# Patient Record
Sex: Male | Born: 1980 | Race: White | Hispanic: No | Marital: Single | Smoking: Current every day smoker
Health system: Southern US, Community
[De-identification: ages and names within clinical notes are randomized; demographics above are authoritative.]

## PROBLEM LIST (undated history)

## (undated) ENCOUNTER — Emergency Department (HOSPITAL_COMMUNITY): Admission: EM | Disposition: A | Discharge status: Other Acute Inpatient Hospital | Payer: Self-pay

## (undated) DIAGNOSIS — E119 Type 2 diabetes mellitus without complications: Secondary | ICD-10-CM

## (undated) DIAGNOSIS — F419 Anxiety disorder, unspecified: Secondary | ICD-10-CM

## (undated) DIAGNOSIS — F32A Depression, unspecified: Secondary | ICD-10-CM

## (undated) HISTORY — PX: KNEE ARTHROSCOPY: SHX127

---
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
## 2003-02-28 ENCOUNTER — Emergency Department (HOSPITAL_COMMUNITY): Admission: EM | Admit: 2003-02-28 | Discharge: 2003-02-28 | Payer: Self-pay | Admitting: Emergency Medicine

## 2003-02-28 ENCOUNTER — Encounter: Payer: Self-pay | Admitting: Emergency Medicine

## 2004-07-07 ENCOUNTER — Emergency Department (HOSPITAL_COMMUNITY): Admission: EM | Admit: 2004-07-07 | Discharge: 2004-07-07 | Payer: Self-pay | Admitting: Emergency Medicine

## 2005-12-01 ENCOUNTER — Emergency Department (HOSPITAL_COMMUNITY): Admission: EM | Admit: 2005-12-01 | Discharge: 2005-12-01 | Payer: Self-pay | Admitting: Emergency Medicine

## 2005-12-27 ENCOUNTER — Emergency Department (HOSPITAL_COMMUNITY): Admission: EM | Admit: 2005-12-27 | Discharge: 2005-12-27 | Payer: Self-pay | Admitting: Emergency Medicine

## 2008-11-02 ENCOUNTER — Emergency Department (HOSPITAL_COMMUNITY): Admission: EM | Admit: 2008-11-02 | Discharge: 2008-11-02 | Payer: Self-pay | Admitting: Emergency Medicine

## 2008-11-04 ENCOUNTER — Emergency Department (HOSPITAL_COMMUNITY): Admission: EM | Admit: 2008-11-04 | Discharge: 2008-11-04 | Payer: Self-pay | Admitting: Emergency Medicine

## 2009-02-06 ENCOUNTER — Ambulatory Visit (HOSPITAL_COMMUNITY): Payer: Self-pay | Admitting: Psychiatry

## 2009-10-26 ENCOUNTER — Emergency Department (HOSPITAL_COMMUNITY): Admission: EM | Admit: 2009-10-26 | Discharge: 2009-10-26 | Payer: Self-pay | Admitting: Emergency Medicine

## 2010-08-30 LAB — GC/CHLAMYDIA PROBE AMP, GENITAL: GC Probe Amp, Genital: NEGATIVE

## 2010-08-30 LAB — CBC
Hemoglobin: 17.5 g/dL — ABNORMAL HIGH (ref 13.0–17.0)
MCV: 92.7 fL (ref 78.0–100.0)
Platelets: 149 10*3/uL — ABNORMAL LOW (ref 150–400)
RBC: 5.33 MIL/uL (ref 4.22–5.81)
RDW: 13.9 % (ref 11.5–15.5)
WBC: 12 10*3/uL — ABNORMAL HIGH (ref 4.0–10.5)

## 2010-08-30 LAB — URINALYSIS, ROUTINE W REFLEX MICROSCOPIC
Bilirubin Urine: NEGATIVE
Ketones, ur: NEGATIVE mg/dL
pH: 6.5 (ref 5.0–8.0)

## 2010-08-30 LAB — DIFFERENTIAL
Basophils Absolute: 0 10*3/uL (ref 0.0–0.1)
Basophils Relative: 0 % (ref 0–1)
Eosinophils Absolute: 0 10*3/uL (ref 0.0–0.7)
Eosinophils Relative: 0 % (ref 0–5)
Monocytes Relative: 6 % (ref 3–12)
Neutro Abs: 9.9 10*3/uL — ABNORMAL HIGH (ref 1.7–7.7)
Neutrophils Relative %: 82 % — ABNORMAL HIGH (ref 43–77)

## 2010-08-30 LAB — BASIC METABOLIC PANEL
BUN: 12 mg/dL (ref 6–23)
Calcium: 9 mg/dL (ref 8.4–10.5)
Chloride: 104 mEq/L (ref 96–112)
Creatinine, Ser: 1.13 mg/dL (ref 0.4–1.5)
GFR calc non Af Amer: >60 mL/min (ref >60)
Potassium: 3.9 mEq/L (ref 3.5–5.1)

## 2010-09-21 LAB — CULTURE, ROUTINE-ABSCESS

## 2010-11-05 ENCOUNTER — Encounter (HOSPITAL_COMMUNITY): Payer: Self-pay | Admitting: Psychology

## 2010-11-05 ENCOUNTER — Encounter (HOSPITAL_COMMUNITY): Payer: Self-pay | Admitting: Physician Assistant

## 2015-08-08 ENCOUNTER — Emergency Department (HOSPITAL_COMMUNITY)
Admission: EM | Admit: 2015-08-08 | Discharge: 2015-08-08 | Disposition: A | Discharge status: Home / Self Care | Payer: Self-pay | Attending: Emergency Medicine | Admitting: Emergency Medicine

## 2015-08-08 ENCOUNTER — Encounter (HOSPITAL_COMMUNITY): Payer: Self-pay | Admitting: *Deleted

## 2015-08-08 ENCOUNTER — Emergency Department (HOSPITAL_COMMUNITY): Payer: Self-pay

## 2015-08-08 DIAGNOSIS — Y998 Other external cause status: Secondary | ICD-10-CM | POA: Insufficient documentation

## 2015-08-08 DIAGNOSIS — Y939 Activity, unspecified: Secondary | ICD-10-CM | POA: Insufficient documentation

## 2015-08-08 DIAGNOSIS — S52614A Nondisplaced fracture of right ulna styloid process, initial encounter for closed fracture: Secondary | ICD-10-CM | POA: Insufficient documentation

## 2015-08-08 DIAGNOSIS — F17210 Nicotine dependence, cigarettes, uncomplicated: Secondary | ICD-10-CM | POA: Insufficient documentation

## 2015-08-08 DIAGNOSIS — S52201A Unspecified fracture of shaft of right ulna, initial encounter for closed fracture: Secondary | ICD-10-CM

## 2015-08-08 DIAGNOSIS — W109XXA Fall (on) (from) unspecified stairs and steps, initial encounter: Secondary | ICD-10-CM | POA: Insufficient documentation

## 2015-08-08 DIAGNOSIS — Y9289 Other specified places as the place of occurrence of the external cause: Secondary | ICD-10-CM | POA: Insufficient documentation

## 2015-08-08 MED ORDER — HYDROCODONE-ACETAMINOPHEN 5-325 MG PO TABS
2.0000 | ORAL_TABLET | Freq: Once | ORAL | Status: AC
  Administered 2015-08-08: 2 via ORAL
  Filled 2015-08-08: qty 2

## 2015-08-08 MED ORDER — IBUPROFEN 800 MG PO TABS: 800.0000 mg | ORAL_TABLET | Freq: Three times a day (TID) | ORAL | Status: DC

## 2015-08-08 MED ORDER — IBUPROFEN 800 MG PO TABS
800.0000 mg | ORAL_TABLET | Freq: Once | ORAL | Status: AC
  Administered 2015-08-08: 800 mg via ORAL
  Filled 2015-08-08: qty 1

## 2015-08-08 MED ORDER — HYDROCODONE-ACETAMINOPHEN 5-325 MG PO TABS: 1.0000 | ORAL_TABLET | ORAL | Status: DC | PRN

## 2015-08-08 NOTE — ED Notes (Signed)
Pt reports falling last night, tripped over steps. Pain in right forearm, swelling noted.

## 2015-08-08 NOTE — ED Provider Notes (Signed)
CSN: [id number]     Arrival date & time 08/08/15  1208 History   First MD Initiated Contact with Patient 08/08/15 1436     Chief Complaint  Patient presents with  . Arm Pain     (Consider location/radiation/quality/duration/timing/severity/associated sxs/prior Treatment) HPI Comments: Pt states he fell going up steps.  Patient is a 35 y.o. male presenting with arm pain. The history is provided by the patient.  Arm Pain This is a new problem. The current episode started yesterday. The problem occurs intermittently. The problem has been gradually worsening. Associated symptoms include arthralgias. Pertinent negatives include no abdominal pain, chest pain, coughing, nausea, neck pain, vomiting or weakness. Exacerbated by: movement of the right arm. He has tried nothing for the symptoms. The treatment provided no relief.    History reviewed. No pertinent past medical history. History reviewed. No pertinent past surgical history. History reviewed. No pertinent family history. Social History  Substance Use Topics  . Smoking status: Current Every Day Smoker -- 1.00 packs/day    Types: Cigarettes  . Smokeless tobacco: None  . Alcohol Use: No     Comment: occ    Review of Systems  Constitutional: Negative for activity change.       All ROS Neg except as noted in HPI  HENT: Negative for nosebleeds.   Eyes: Negative for photophobia and discharge.  Respiratory: Negative for cough, shortness of breath and wheezing.   Cardiovascular: Negative for chest pain and palpitations.  Gastrointestinal: Negative for nausea, vomiting, abdominal pain and blood in stool.  Genitourinary: Negative for dysuria, frequency and hematuria.  Musculoskeletal: Positive for arthralgias. Negative for back pain and neck pain.  Skin: Negative.   Neurological: Negative for dizziness, seizures, speech difficulty and weakness.  Psychiatric/Behavioral: Negative for hallucinations and confusion.      Allergies   Review of patient's allergies indicates no known allergies.  Home Medications   Prior to Admission medications   Not on File   BP 135/96 mmHg  Pulse 99  Temp(Src) 97.8 F (36.6 C) (Temporal)  Resp 18  Ht  (1.803 m)  Wt 93.441 kg  BMI 28.74 kg/m2  SpO2 100% Physical Exam  Constitutional: He is oriented to person, place, and time. He appears well-developed and well-nourished.  Non-toxic appearance.  HENT:  Head: Normocephalic.  Right Ear: Tympanic membrane and external ear normal.  Left Ear: Tympanic membrane and external ear normal.  Eyes: EOM and lids are normal. Pupils are equal, round, and reactive to light.  Neck: Normal range of motion. Neck supple. Carotid bruit is not present.  Cardiovascular: Normal rate, regular rhythm, normal heart sounds, intact distal pulses and normal pulses.   Pulmonary/Chest: Breath sounds normal. No respiratory distress.  Abdominal: Soft. Bowel sounds are normal. There is no tenderness. There is no guarding.  Musculoskeletal: Normal range of motion.       Right shoulder: Normal.       Right elbow: Normal.      Right forearm: He exhibits tenderness, bony tenderness and swelling.  Compartments are soft.  Lymphadenopathy:       Head (right side): No submandibular adenopathy present.       Head (left side): No submandibular adenopathy present.    He has no cervical adenopathy.  Neurological: He is alert and oriented to person, place, and time. He has normal strength. No cranial nerve deficit or sensory deficit.  Skin: Skin is warm and dry.  Psychiatric: He has a normal mood and affect. His speech  is normal.  Nursing note and vitals reviewed.   ED Course  FRACTURE CARE RIGHT FOREARM.  Procedures (including critical care time) Labs Review Labs Reviewed - No data to display  Imaging Review Dg Forearm Right  08/08/2015  CLINICAL DATA:  Right forearm pain. Fell down stairs last night landing on right arm. EXAM: RIGHT FOREARM - 2 VIEW  COMPARISON:  None. FINDINGS: There is a nondisplaced transverse fracture through the distal shaft of the right ulna. No radial abnormality. Mild overlying soft tissue swelling. IMPRESSION: Nondisplaced distal right ulnar shaft fracture. Electronically Signed   By: Charlett Nose M.D.   On: 08/08/2015 12:48   I have personally reviewed and evaluated these images and lab results as part of my medical decision-making.   EKG Interpretation None      MDM  Xray of the right forearm reveal a nondisplaced fracture of the right ulnar shaft. No other fx or dislocation. Discussed findings with the patient. Pt fitted with Ulnar Gutter splint and sling. Pt referred to orthopedics. Rx for ibuprofen and norco given to the patient.   Final diagnoses:  Ulnar fracture, right, closed, initial encounter    *I have reviewed nursing notes, vital signs, and all appropriate lab and imaging results for this patient.Ivery Quale, PA-C 08/10/15 1057  Glynn Octave, MD 08/10/15 [phone]

## 2015-08-08 NOTE — Discharge Instructions (Signed)
Please keep your arm elevated. Please apply ice to the fractured area. Please see Dr. Romeo Apple, or the orthopedic specialist of your choice as soon as possible. Use ibuprofen 3 times daily with a meal for inflammation and swelling. Use Norco every 4 hours as needed for more severe pain. Norco may cause drowsiness, please do not drink, drive, operate machinery, or participate in activities requiring concentration. Forearm Fracture A forearm fracture is a break in one or both of the bones of your arm that are between the elbow and the wrist. Your forearm is made up of two bones:  Radius. This is the bone on the inside of your arm near your thumb.  Ulna. This is the bone on the outside of your arm near your little finger. Middle forearm fractures usually break both the radius and the ulna. Most forearm fractures that involve both the ulna and radius will require surgery. CAUSES Common causes of this type of fracture include:  Falling on an outstretched arm.  Accidents, such as a car or bike accident.  A hard, direct hit to the middle part of your arm. RISK FACTORS You may be at higher risk for this type of fracture if:  You play contact sports.  You have a condition that causes your bones to be weak or thin (osteoporosis). SIGNS AND SYMPTOMS A forearm fracture causes pain immediately after the injury. Other signs and symptoms include:  An abnormal bend or bump in your arm (deformity).  Swelling.  Numbness or tingling.  Tenderness.  Inability to turn your hand from side to side (rotate).  Bruising. DIAGNOSIS Your health care provider may diagnose a forearm fracture based on:  Your symptoms.  Your medical history, including any recent injury.  A physical exam. Your health care provider will look for any deformity and feel for tenderness over the break. Your health care provider will also check whether the bones are out of place.  An X-ray exam to confirm the diagnosis and  learn more about the type of fracture. TREATMENT The goals of treatment are to get the bone or bones in proper position for healing and to keep the bones from moving so they will heal over time. Your treatment will depend on many factors, especially the type of fracture that you have.  If the fractured bone or bones:  Are in the correct position (nondisplaced), you may only need to wear a cast or a splint.  Have a slightly displaced fracture, you may need to have the bones moved back into place manually (closed reduction) before the splint or cast is put on.  You may have a temporary splint before you have a cast. The splint allows room for some swelling. After a few days, a cast can replace the splint.  You may have to wear the cast for 6-8 weeks or as directed by your health care provider.  The cast may be changed after about 3 weeks or as directed by your health care provider.  After your cast is removed, you may need physical therapy to regain full movement in your wrist or elbow.  You may need emergency surgery if you have:  A fractured bone or bones that are out of position (displaced).  A fracture with multiple fragments (comminuted fracture).  A fracture that breaks the skin (open fracture). This type of fracture may require surgical wires, plates, or screws to hold the bone or bones in place.  You may have X-rays every couple of weeks to check  on your healing. HOME CARE INSTRUCTIONS If You Have a Cast:  Do not stick anything inside the cast to scratch your skin. Doing that increases your risk of infection.  Check the skin around the cast every day. Report any concerns to your health care provider. You may put lotion on dry skin around the edges of the cast. Do not apply lotion to the skin underneath the cast. If You Have a Splint:  Wear it as directed by your health care provider. Remove it only as directed by your health care provider.  Loosen the splint if your fingers  become numb and tingle, or if they turn cold and blue. Bathing  Cover the cast or splint with a watertight plastic bag to protect it from water while you bathe or shower. Do not let the cast or splint get wet. Managing Pain, Stiffness, and Swelling  If directed, apply ice to the injured area:  Put ice in a plastic bag.  Place a towel between your skin and the bag.  Leave the ice on for 20 minutes, 2-3 times a day.  Move your fingers often to avoid stiffness and to lessen swelling.  Raise the injured area above the level of your heart while you are sitting or lying down. Driving  Do not drive or operate heavy machinery while taking pain medicine.  Do not drive while wearing a cast or splint on a hand that you use for driving. Activity  Return to your normal activities as directed by your health care provider. Ask your health care provider what activities are safe for you.  Perform range-of-motion exercises only as directed by your health care provider. Safety  Do not use your injured limb to support your body weight until your health care provider says that you can. General Instructions  Do not put pressure on any part of the cast or splint until it is fully hardened. This may take several hours.  Keep the cast or splint clean and dry.  Do not use any tobacco products, including cigarettes, chewing tobacco, or electronic cigarettes. Tobacco can delay bone healing. If you need help quitting, ask your health care provider.  Take medicines only as directed by your health care provider.  Keep all follow-up visits as directed by your health care provider. This is important. SEEK MEDICAL CARE IF:  Your pain medicine is not helping.  Your cast or splint becomes wet or damaged or suddenly feels too tight.  Your cast becomes loose.  You have more severe pain or swelling than you did before the cast.  You have severe pain when you stretch your fingers.  You continue to have  pain or stiffness in your elbow or your wrist after your cast is removed. SEEK IMMEDIATE MEDICAL CARE IF:  You cannot move your fingers.  You lose feeling in your fingers or your hand.  Your hand or your fingers turn cold and pale or blue.  You notice a bad smell coming from your cast.  You have drainage from underneath your cast.  You have new stains from blood or drainage that is coming through your cast.   This information is not intended to replace advice given to you by your health care provider. Make sure you discuss any questions you have with your health care provider.   Document Released: 05/27/2000 Document Revised: 06/20/2014 Document Reviewed: 01/13/2014 Elsevier Interactive Patient Education [employer]

## 2016-12-27 ENCOUNTER — Emergency Department (HOSPITAL_COMMUNITY): Payer: Self-pay

## 2016-12-27 ENCOUNTER — Observation Stay (HOSPITAL_COMMUNITY)
Admission: EM | Admit: 2016-12-27 | Discharge: 2016-12-29 | Disposition: A | Discharge status: Home / Self Care | Payer: Self-pay | Attending: Internal Medicine | Admitting: Internal Medicine

## 2016-12-27 ENCOUNTER — Encounter (HOSPITAL_COMMUNITY): Payer: Self-pay | Admitting: Emergency Medicine

## 2016-12-27 DIAGNOSIS — E871 Hypo-osmolality and hyponatremia: Secondary | ICD-10-CM | POA: Diagnosis present

## 2016-12-27 DIAGNOSIS — G8929 Other chronic pain: Secondary | ICD-10-CM | POA: Insufficient documentation

## 2016-12-27 DIAGNOSIS — E1100 Type 2 diabetes mellitus with hyperosmolarity without nonketotic hyperglycemic-hyperosmolar coma (NKHHC): Principal | ICD-10-CM | POA: Insufficient documentation

## 2016-12-27 DIAGNOSIS — E876 Hypokalemia: Secondary | ICD-10-CM | POA: Insufficient documentation

## 2016-12-27 DIAGNOSIS — R739 Hyperglycemia, unspecified: Secondary | ICD-10-CM

## 2016-12-27 DIAGNOSIS — F17210 Nicotine dependence, cigarettes, uncomplicated: Secondary | ICD-10-CM | POA: Insufficient documentation

## 2016-12-27 DIAGNOSIS — N179 Acute kidney failure, unspecified: Secondary | ICD-10-CM | POA: Diagnosis present

## 2016-12-27 DIAGNOSIS — Z833 Family history of diabetes mellitus: Secondary | ICD-10-CM | POA: Insufficient documentation

## 2016-12-27 DIAGNOSIS — R0789 Other chest pain: Secondary | ICD-10-CM | POA: Insufficient documentation

## 2016-12-27 DIAGNOSIS — B370 Candidal stomatitis: Secondary | ICD-10-CM | POA: Insufficient documentation

## 2016-12-27 DIAGNOSIS — Z720 Tobacco use: Secondary | ICD-10-CM | POA: Diagnosis present

## 2016-12-27 DIAGNOSIS — M545 Low back pain: Secondary | ICD-10-CM | POA: Insufficient documentation

## 2016-12-27 LAB — COMPREHENSIVE METABOLIC PANEL
ALK PHOS: 144 U/L — AB (ref 38–126)
ALT: 16 U/L — AB (ref 17–63)
AST: 17 U/L (ref 15–41)
Albumin: 4 g/dL (ref 3.5–5.0)
Anion gap: 11 (ref 5–15)
BILIRUBIN TOTAL: 1.3 mg/dL — AB (ref 0.3–1.2)
BUN: 10 mg/dL (ref 6–20)
CALCIUM: 8.6 mg/dL — AB (ref 8.9–10.3)
CHLORIDE: 87 mmol/L — AB (ref 101–111)
CO2: 27 mmol/L (ref 22–32)
CREATININE: 1.39 mg/dL — AB (ref 0.61–1.24)
GFR calc Af Amer: >60 mL/min (ref >60)
Glucose, Bld: 1087 mg/dL (ref 65–99)
Potassium: 3.8 mmol/L (ref 3.5–5.1)
Sodium: 125 mmol/L — ABNORMAL LOW (ref 135–145)
Total Protein: 6.1 g/dL — ABNORMAL LOW (ref 6.5–8.1)

## 2016-12-27 LAB — BASIC METABOLIC PANEL
ANION GAP: 7 (ref 5–15)
BUN: 8 mg/dL (ref 6–20)
CHLORIDE: 104 mmol/L (ref 101–111)
CO2: 28 mmol/L (ref 22–32)
Calcium: 8.1 mg/dL — ABNORMAL LOW (ref 8.9–10.3)
Creatinine, Ser: 0.98 mg/dL (ref 0.61–1.24)
GFR calc Af Amer: >60 mL/min (ref >60)
GLUCOSE: 244 mg/dL — AB (ref 65–99)
POTASSIUM: 2.6 mmol/L — AB (ref 3.5–5.1)
Sodium: 139 mmol/L (ref 135–145)

## 2016-12-27 LAB — CBC WITH DIFFERENTIAL/PLATELET
BASOS PCT: 0 %
Basophils Absolute: 0 10*3/uL (ref 0.0–0.1)
EOS ABS: 0.1 10*3/uL (ref 0.0–0.7)
EOS PCT: 2 %
HCT: 44 % (ref 39.0–52.0)
Hemoglobin: 15.7 g/dL (ref 13.0–17.0)
LYMPHS ABS: 0.7 10*3/uL (ref 0.7–4.0)
Lymphocytes Relative: 16 %
MCH: 30.9 pg (ref 26.0–34.0)
MCHC: 35.7 g/dL (ref 30.0–36.0)
MCV: 86.6 fL (ref 78.0–100.0)
Monocytes Absolute: 0.4 10*3/uL (ref 0.1–1.0)
Monocytes Relative: 9 %
Neutro Abs: 3.4 10*3/uL (ref 1.7–7.7)
Neutrophils Relative %: 73 %
PLATELETS: 154 10*3/uL (ref 150–400)
RBC: 5.08 MIL/uL (ref 4.22–5.81)
RDW: 12.8 % (ref 11.5–15.5)
WBC: 4.6 10*3/uL (ref 4.0–10.5)

## 2016-12-27 LAB — RAPID STREP SCREEN (MED CTR MEBANE ONLY): STREPTOCOCCUS, GROUP A SCREEN (DIRECT): NEGATIVE

## 2016-12-27 LAB — GLUCOSE, CAPILLARY
GLUCOSE-CAPILLARY: 248 mg/dL — AB (ref 65–99)
GLUCOSE-CAPILLARY: 185 mg/dL — AB (ref 65–99)
GLUCOSE-CAPILLARY: 222 mg/dL — AB (ref 65–99)
GLUCOSE-CAPILLARY: 166 mg/dL — AB (ref 65–99)
GLUCOSE-CAPILLARY: 116 mg/dL — AB (ref 65–99)
GLUCOSE-CAPILLARY: 97 mg/dL (ref 65–99)
GLUCOSE-CAPILLARY: 90 mg/dL (ref 65–99)
Glucose-Capillary: 268 mg/dL — ABNORMAL HIGH (ref 65–99)

## 2016-12-27 LAB — OSMOLALITY: Osmolality: 327 mOsm/kg (ref 275–295)

## 2016-12-27 LAB — CBG MONITORING, ED
Glucose-Capillary: >600 mg/dL (ref 65–99)
Glucose-Capillary: 501 mg/dL (ref 65–99)
Glucose-Capillary: 346 mg/dL — ABNORMAL HIGH (ref 65–99)
Glucose-Capillary: 291 mg/dL — ABNORMAL HIGH (ref 65–99)

## 2016-12-27 LAB — MAGNESIUM: Magnesium: 2 mg/dL (ref 1.7–2.4)

## 2016-12-27 LAB — TROPONIN I: Troponin I: <0.03 ng/mL (ref <0.03)

## 2016-12-27 LAB — MRSA PCR SCREENING: MRSA by PCR: NEGATIVE

## 2016-12-27 LAB — LIPASE, BLOOD: Lipase: 77 U/L — ABNORMAL HIGH (ref 11–51)

## 2016-12-27 MED ORDER — INSULIN ASPART 100 UNIT/ML ~~LOC~~ SOLN
5.0000 [IU] | Freq: Three times a day (TID) | SUBCUTANEOUS | Status: DC
  Administered 2016-12-28 – 2016-12-29 (×4): 5 [IU] via SUBCUTANEOUS

## 2016-12-27 MED ORDER — INSULIN ASPART 100 UNIT/ML ~~LOC~~ SOLN
0.0000 [IU] | Freq: Three times a day (TID) | SUBCUTANEOUS | Status: DC
  Administered 2016-12-28 (×2): 7 [IU] via SUBCUTANEOUS
  Administered 2016-12-28: 3 [IU] via SUBCUTANEOUS
  Administered 2016-12-29: 9 [IU] via SUBCUTANEOUS
  Administered 2016-12-29: 7 [IU] via SUBCUTANEOUS

## 2016-12-27 MED ORDER — ACETAMINOPHEN 650 MG RE SUPP: 650.0000 mg | Freq: Four times a day (QID) | RECTAL | Status: DC | PRN

## 2016-12-27 MED ORDER — KETOROLAC TROMETHAMINE 30 MG/ML IJ SOLN: 30.0000 mg | Freq: Four times a day (QID) | INTRAMUSCULAR | Status: DC | PRN

## 2016-12-27 MED ORDER — NYSTATIN 100000 UNIT/ML MT SUSP
5.0000 mL | Freq: Four times a day (QID) | OROMUCOSAL | Status: DC
  Administered 2016-12-27 – 2016-12-29 (×6): 500000 [IU] via ORAL
  Filled 2016-12-27 (×6): qty 5

## 2016-12-27 MED ORDER — DEXTROSE 50 % IV SOLN: 25.0000 mL | INTRAVENOUS | Status: DC | PRN

## 2016-12-27 MED ORDER — POTASSIUM CHLORIDE IN NACL 40-0.9 MEQ/L-% IV SOLN
INTRAVENOUS | Status: DC
  Administered 2016-12-27 – 2016-12-28 (×2): 125 mL/h via INTRAVENOUS

## 2016-12-27 MED ORDER — ENOXAPARIN SODIUM 40 MG/0.4ML ~~LOC~~ SOLN
40.0000 mg | SUBCUTANEOUS | Status: DC
  Administered 2016-12-28: 40 mg via SUBCUTANEOUS
  Filled 2016-12-27: qty 0.4

## 2016-12-27 MED ORDER — SODIUM CHLORIDE 0.9 % IV SOLN: INTRAVENOUS | Status: DC

## 2016-12-27 MED ORDER — SODIUM CHLORIDE 0.9 % IV SOLN
INTRAVENOUS | Status: DC
  Administered 2016-12-27: 18:00:00 via INTRAVENOUS

## 2016-12-27 MED ORDER — NICOTINE 21 MG/24HR TD PT24
21.0000 mg | MEDICATED_PATCH | Freq: Once | TRANSDERMAL | Status: AC
  Administered 2016-12-27: 21 mg via TRANSDERMAL
  Filled 2016-12-27: qty 1

## 2016-12-27 MED ORDER — INSULIN REGULAR BOLUS VIA INFUSION
0.0000 [IU] | Freq: Three times a day (TID) | INTRAVENOUS | Status: DC
  Filled 2016-12-27: qty 10

## 2016-12-27 MED ORDER — ACETAMINOPHEN 325 MG PO TABS
650.0000 mg | ORAL_TABLET | Freq: Four times a day (QID) | ORAL | Status: DC | PRN
  Administered 2016-12-28: 650 mg via ORAL
  Filled 2016-12-27: qty 2

## 2016-12-27 MED ORDER — INSULIN GLARGINE 100 UNIT/ML ~~LOC~~ SOLN
10.0000 [IU] | Freq: Every day | SUBCUTANEOUS | Status: DC
  Administered 2016-12-27 – 2016-12-28 (×2): 10 [IU] via SUBCUTANEOUS
  Filled 2016-12-27 (×3): qty 0.1

## 2016-12-27 MED ORDER — ONDANSETRON HCL 4 MG/2ML IJ SOLN: 4.0000 mg | Freq: Four times a day (QID) | INTRAMUSCULAR | Status: DC | PRN

## 2016-12-27 MED ORDER — HYDROXYZINE HCL 25 MG PO TABS
25.0000 mg | ORAL_TABLET | Freq: Three times a day (TID) | ORAL | Status: DC | PRN
  Administered 2016-12-27 – 2016-12-29 (×5): 25 mg via ORAL
  Filled 2016-12-27 (×5): qty 1

## 2016-12-27 MED ORDER — INSULIN REGULAR BOLUS VIA INFUSION
0.0000 [IU] | Freq: Three times a day (TID) | INTRAVENOUS | Status: DC
  Administered 2016-12-27: 6.1 [IU] via INTRAVENOUS
  Filled 2016-12-27: qty 10

## 2016-12-27 MED ORDER — DEXTROSE-NACL 5-0.45 % IV SOLN
INTRAVENOUS | Status: DC
  Administered 2016-12-27: 16:00:00 via INTRAVENOUS

## 2016-12-27 MED ORDER — SODIUM CHLORIDE 0.9 % IV BOLUS (SEPSIS)
1000.0000 mL | Freq: Once | INTRAVENOUS | Status: AC
  Administered 2016-12-27: 1000 mL via INTRAVENOUS

## 2016-12-27 MED ORDER — SODIUM CHLORIDE 0.9 % IV SOLN
INTRAVENOUS | Status: DC
  Administered 2016-12-27: 18:00:00 via INTRAVENOUS
  Filled 2016-12-27: qty 1

## 2016-12-27 MED ORDER — POTASSIUM CHLORIDE CRYS ER 20 MEQ PO TBCR
40.0000 meq | EXTENDED_RELEASE_TABLET | ORAL | Status: AC
  Administered 2016-12-27 (×2): 40 meq via ORAL
  Filled 2016-12-27 (×2): qty 2

## 2016-12-27 MED ORDER — SODIUM CHLORIDE 0.9 % IV SOLN
INTRAVENOUS | Status: DC
  Administered 2016-12-27 (×2): via INTRAVENOUS

## 2016-12-27 MED ORDER — ONDANSETRON HCL 4 MG PO TABS
4.0000 mg | ORAL_TABLET | Freq: Four times a day (QID) | ORAL | Status: DC | PRN
  Administered 2016-12-27: 4 mg via ORAL
  Filled 2016-12-27: qty 1

## 2016-12-27 MED ORDER — SODIUM CHLORIDE 0.9 % IV SOLN
INTRAVENOUS | Status: DC
  Administered 2016-12-27: 5.4 [IU]/h via INTRAVENOUS
  Filled 2016-12-27: qty 1

## 2016-12-27 MED ORDER — SODIUM CHLORIDE 0.9 % IV BOLUS (SEPSIS)
2000.0000 mL | Freq: Once | INTRAVENOUS | Status: AC
  Administered 2016-12-27: 2000 mL via INTRAVENOUS

## 2016-12-27 NOTE — H&P (Signed)
History and Physical    Elijah Miller:[id number] DOB: 29-Oct-1980 DOA: 12/27/2016  PCP: Patient, No Pcp Per  Patient coming from: home  I have personally briefly reviewed patient's old medical records in [facility]  Chief Complaint: weakness  HPI: Elijah Miller is a 36 y.o. male with no significant past medical history, discontinuation is removed today with complaints of generalized weakness. He feels that he is receiving weakness for the last several months. She is also noticed increased urination for the past 2 weeks. February vision for the past week. Polydipsia for the past 2 weeks. He has noticed an unintentional 20 pound weight loss in the past few weeks, but felt it was related to physical exercise related to his job since coming to the ER, he does describe some sore throat. He has experienced chest pain across his chest, but feels this may be related to anxiety. He does feel very anxious. He had an episode of vomiting yesterday, but none since then. He has been more constipated in the past few weeks. Denies any fever.  ED Course: Vitals are noted to be stable on the emergency room. Labs showed elevated blood sugar of 1087, sodium of 125. Mildly elevated creatinine of 1.39. Rapid strep status was negative. EKG did not show any acute findings. He was started on IV fluids, IV insulin and referred for admission.  Review of Systems: As per HPI otherwise 10 point review of systems negative.    History reviewed. No pertinent past medical history.  History reviewed. No pertinent surgical history.   reports that he has been smoking Cigarettes.  He has been smoking about 1.00 pack per day. He has never used smokeless tobacco. He reports that he does not drink alcohol or use drugs.  No Known Allergies  Family history: positive for diabetes in uncle and grand father  Prior to Admission medications   Medication Sig Start Date End Date Taking? Authorizing Provider    Pseudoeph-Doxylamine-DM-APAP (NYQUIL PO) Take 30 mLs by mouth daily as needed (sleep).   Yes [provider]    Physical Exam: Vitals:   12/27/16 1330 12/27/16 1450 12/27/16 1502 12/27/16 1615  BP: 115/82 115/84    Pulse: 70   73  Resp: 18   17  Temp:   97.6 F (36.4 C) 97.6 F (36.4 C)  TempSrc:   Oral Oral  SpO2: 100%   100%  Weight:   76.4 kg (168 lb 6.9 oz)   Height:   6' (1.829 m)     Constitutional: NAD, calm, comfortable Vitals:   12/27/16 1330 12/27/16 1450 12/27/16 1502 12/27/16 1615  BP: 115/82 115/84    Pulse: 70   73  Resp: 18   17  Temp:   97.6 F (36.4 C) 97.6 F (36.4 C)  TempSrc:   Oral Oral  SpO2: 100%   100%  Weight:   76.4 kg (168 lb 6.9 oz)   Height:   6' (1.829 m)    Eyes: PERRL, lids and conjunctivae normal ENMT: Mucous membranes are moist. Posterior pharynx Shows evidence of oral thrush.Normal dentition.  Neck: normal, supple, no masses, no thyromegaly Respiratory: clear to auscultation bilaterally, no wheezing, no crackles. Normal respiratory effort. No accessory muscle use.  Cardiovascular: Regular rate and rhythm, no murmurs / rubs / gallops. No extremity edema. 2+ pedal pulses. No carotid bruits.  Abdomen: no tenderness, no masses palpated. No hepatosplenomegaly. Bowel sounds positive.  Musculoskeletal: no clubbing / cyanosis. No joint deformity upper  and lower extremities. Good ROM, no contractures. Normal muscle tone.  Skin: no rashes, lesions, ulcers. No induration Neurologic: CN 2-12 grossly intact. Sensation intact, DTR normal. Strength 5/5 in all 4.  Psychiatric: Normal judgment and insight. Alert and oriented x 3. Normal mood.   Labs on Admission: I have personally reviewed following labs and imaging studies  CBC:  Recent Labs Lab 12/27/16 0902  WBC 4.6  NEUTROABS 3.4  HGB 15.7  HCT 44.0  MCV 86.6  PLT 154   Basic Metabolic Panel:  Recent Labs Lab 12/27/16 0902 12/27/16 1611  NA 125* 139  K 3.8 2.6*  CL 87*  104  CO2 27 28  GLUCOSE 1,087* 244*  BUN 10 8  CREATININE 1.39* 0.98  CALCIUM 8.6* 8.1*   GFR: Estimated Creatinine Clearance: 115.9 mL/min (by C-G formula based on SCr of 0.98 mg/dL). Liver Function Tests:  Recent Labs Lab 12/27/16 0902  AST 17  ALT 16*  ALKPHOS 144*  BILITOT 1.3*  PROT 6.1*  ALBUMIN 4.0   No results for input(s): LIPASE, AMYLASE in the last 168 hours. No results for input(s): AMMONIA in the last 168 hours. Coagulation Profile: No results for input(s): INR, PROTIME in the last 168 hours. Cardiac Enzymes: No results for input(s): CKTOTAL, CKMB, CKMBINDEX, TROPONINI in the last 168 hours. BNP (last 3 results) No results for input(s): PROBNP in the last 8760 hours. HbA1C: No results for input(s): HGBA1C in the last 72 hours. CBG:  Recent Labs Lab 12/27/16 1322 12/27/16 1425 12/27/16 1528 12/27/16 1642 12/27/16 1728  GLUCAP 346* 291* 248* 185* 268*   Lipid Profile: No results for input(s): CHOL, HDL, LDLCALC, TRIG, CHOLHDL, LDLDIRECT in the last 72 hours. Thyroid Function Tests: No results for input(s): TSH, T4TOTAL, FREET4, T3FREE, THYROIDAB in the last 72 hours. Anemia Panel: No results for input(s): VITAMINB12, FOLATE, FERRITIN, TIBC, IRON, RETICCTPCT in the last 72 hours. Urine analysis:    Component Value Date/Time   COLORURINE YELLOW 10/26/2009 1250   APPEARANCEUR CLEAR 10/26/2009 1250   LABSPEC 1.025 10/26/2009 1250   PHURINE 6.5 10/26/2009 1250   GLUCOSEU NEGATIVE 10/26/2009 1250   HGBUR NEGATIVE 10/26/2009 1250   BILIRUBINUR NEGATIVE 10/26/2009 1250   KETONESUR NEGATIVE 10/26/2009 1250   PROTEINUR NEGATIVE 10/26/2009 1250   UROBILINOGEN 0.2 10/26/2009 1250   NITRITE NEGATIVE 10/26/2009 1250   LEUKOCYTESUR  10/26/2009 1250    NEGATIVE MICROSCOPIC NOT DONE ON URINES WITH NEGATIVE PROTEIN, BLOOD, LEUKOCYTES, NITRITE, OR GLUCOSE <1000 mg/dL.    Radiological Exams on Admission: Dg Chest 2 View  Result Date: 12/27/2016 CLINICAL  DATA:  Shortness of breath with cough EXAM: CHEST  2 VIEW COMPARISON:  January 03, 2011 FINDINGS: There is no edema or consolidation. The heart size and pulmonary vascularity are within normal limits. No adenopathy. No pneumothorax. No bone lesions. IMPRESSION: No edema or consolidation. Electronically Signed   By: Bretta Bang III M.D.   On: 12/27/2016 09:52    EKG: Independently reviewed. No acute findings  Assessment/Plan Active Problems:   Diabetic hyperosmolar non-ketotic state (HCC)   Hyponatremia   Oral thrush   AKI (acute kidney injury) (HCC)   Tobacco abuse    1. Diabetic hyperosmolar nonketotic state. New-onset of diabetes. Anion gap was 11 on admission. He's been started on intravenous hydration and insulin. We'll continue to monitor his blood sugars. Once the RN goal range, he can be transitioned to subcutaneous insulin. Check hemoglobin A1c. Check insulin autoantibodies, glutamic acid decarboxylase autoantibodies. 2. Chest discomfort. Believed to  anxiety. Check lipase, troponin. 3. Acute kidney injury. Related to dehydration with hyperglycemia. Continue to hydrate with IV fluids. 4. Oral thrush. Started on Oral nystatin. 5. Hyponatremia. Likely pseudohyponatremia setting of hyperglycemia. Continue to monitor. 6. Tobacco abuse. Started on nicotine patch  DVT prophylaxis: lovenox Code Status: full code Family Communication: discussed with family at the bedside Disposition Plan: discharge home once improved, possibly in AM Consults called:  Admission status: observation, stepdown   Chyane Greer MD [facility] Pager [phone]  If 7PM-7AM, please contact night-coverage [email or web address] Password [email or web address]  12/27/2016, 5:54 PM

## 2016-12-27 NOTE — ED Provider Notes (Signed)
Emergency Department Provider Note   I have reviewed the triage vital signs and the nursing notes.   HISTORY  Chief Complaint Shortness of Breath   HPI Elijah Miller is a 36 y.o. male with PMH of tobacco use presents to the emergency room in for evaluation of generalized weakness, lightheadedness, cough, difficulty breathing, sore throat over the past 48 hours. He gave plasma yesterday which he does frequently. He also went swimming in a cold swimming pool which he thinks may be related. He denies any fevers or chills. No sick contacts. He reports a significant sore throat that's worse with swallowing. No voice changes. Patient reports having increased difficult to breathing at work. He continues to smoke cigarettes but is trying to cut back. He currently smokes 7-8 cigarettes per day. Denies any chest pain or abdominal discomfort. He did have one episode of nonbloody emesis yesterday. No diarrhea. Patient also notes approximately 2 weeks of intermittent blurry vision with no provoking factors.    History reviewed. No pertinent past medical history.  Patient Active Problem List   Diagnosis Date Noted  . Diabetic hyperosmolar non-ketotic state (HCC) 12/27/2016    History reviewed. No pertinent surgical history.  Current Outpatient Rx  . Order #: [id number] Class: Historical Med    Allergies Patient has no known allergies.  History reviewed. No pertinent family history.  Social History Social History  Substance Use Topics  . Smoking status: Current Every Day Smoker    Packs/day: 1.00    Types: Cigarettes  . Smokeless tobacco: Never Used  . Alcohol use No     Comment: occ    Review of Systems  Constitutional: No fever/chills. Positive fatigue and generalized weakness.  Eyes: No visual changes. ENT: Positive sore throat. Cardiovascular: Denies chest pain. Respiratory: Positive shortness of breath and cough.  Gastrointestinal: No abdominal pain.  No nausea, Positive  vomiting.  No diarrhea.  No constipation. Genitourinary: Negative for dysuria. Musculoskeletal: Negative for back pain. Skin: Negative for rash. Neurological: Negative for headaches, focal weakness or numbness.  10-point ROS otherwise negative.  ____________________________________________   PHYSICAL EXAM:  VITAL SIGNS: Vitals:   12/27/16 0930 12/27/16 1000  BP: 116/82 (!) 123/91  Pulse: 87 69  Resp: 17 13     Constitutional: Alert and oriented. Well appearing and in no acute distress. Eyes: Conjunctivae are normal. PERRL. EOMI. Head: Atraumatic. Nose: No congestion/rhinnorhea. Mouth/Throat: Mucous membranes are dry. Oropharynx is erythematous with tonsillar exudates.  Neck: No stridor.  No meningeal signs.  Cardiovascular: Normal rate, regular rhythm. Good peripheral circulation. Grossly normal heart sounds.   Respiratory: Normal respiratory effort.  No retractions. Lungs CTAB. Gastrointestinal: Soft and nontender. No distention.  Musculoskeletal: No lower extremity tenderness nor edema. No gross deformities of extremities. Neurologic:  Normal speech and language. No gross focal neurologic deficits are appreciated.  Skin:  Skin is warm, dry and intact. No rash noted.  ____________________________________________   LABS (all labs ordered are listed, but only abnormal results are displayed)  Labs Reviewed  COMPREHENSIVE METABOLIC PANEL - Abnormal; Notable for the following:       Result Value   Sodium 125 (*)    Chloride 87 (*)    Glucose, Bld 1,087 (*)    Creatinine, Ser 1.39 (*)    Calcium 8.6 (*)    Total Protein 6.1 (*)    ALT 16 (*)    Alkaline Phosphatase 144 (*)    Total Bilirubin 1.3 (*)    All other components within normal  limits  RAPID STREP SCREEN (NOT AT [facility])  CULTURE, GROUP A STREP [facility])  CBC WITH DIFFERENTIAL/PLATELET  OSMOLALITY   ____________________________________________  EKG   EKG Interpretation  Date/Time:  Tuesday December 27 2016  09:29:15 EDT Ventricular Rate:  75 PR Interval:    QRS Duration: 95 QT Interval:  405 QTC Calculation: 453 R Axis:   72 Text Interpretation:  Sinus rhythm ST elev, probable normal early repol pattern No STEMI.  Confirmed by Alona Bene [phone]) on 12/27/2016 9:39:15 AM       ____________________________________________  RADIOLOGY  Dg Chest 2 View  Result Date: 12/27/2016 CLINICAL DATA:  Shortness of breath with cough EXAM: CHEST  2 VIEW COMPARISON:  January 03, 2011 FINDINGS: There is no edema or consolidation. The heart size and pulmonary vascularity are within normal limits. No adenopathy. No pneumothorax. No bone lesions. IMPRESSION: No edema or consolidation. Electronically Signed   By: Bretta Bang III M.D.   On: 12/27/2016 09:52    ____________________________________________   PROCEDURES  Procedure(s) performed:   Procedures  CRITICAL CARE Performed by: Maia Plan Total critical care time: 30 minutes Critical care time was exclusive of separately billable procedures and treating other patients. Critical care was necessary to treat or prevent imminent or life-threatening deterioration. Critical care was time spent personally by me on the following activities: development of treatment plan with patient and/or surrogate as well as nursing, discussions with consultants, evaluation of patient's response to treatment, examination of patient, obtaining history from patient or surrogate, ordering and performing treatments and interventions, ordering and review of laboratory studies, ordering and review of radiographic studies, pulse oximetry and re-evaluation of patient's condition.  Alona Bene, MD Emergency Medicine  ____________________________________________   INITIAL IMPRESSION / ASSESSMENT AND PLAN / ED COURSE  Pertinent labs & imaging results that were available during my care of the patient were reviewed by me and considered in my medical decision making (see  chart for details).  Patient presents to the emergency department for evaluation of complaints including generalized weakness, lightheadedness, shortness of breath, cough, sore throat. Doubt that plasma donation is related to the patient's symptoms. No focal findings on lung exam to suggest infiltrate. Plan for chest x-ray for further evaluation along with labs given his generalized weakness to assess kidney function and electrolytes. Plan also screen for anemia. Patient throat swab for strep with his tonsillar exudates on exam.   I discussed smoking cessation in detail with the patient. He is attempting to cut back and will continue to work to stop completely. His partner at bedside is also trying to quit.   10:40 AM Insulin infusion ordered. Spoke with hospitalist who will admit.   Discussed patient's case with Hospitlaist, Dr. Kerry Hough. Patient and family (if present) updated with plan. Care transferred to Hospitalist service.  I reviewed all nursing notes, vitals, pertinent old records, EKGs, labs, imaging (as available).   ____________________________________________  FINAL CLINICAL IMPRESSION(S) / ED DIAGNOSES  Final diagnoses:  Hyperglycemia     MEDICATIONS GIVEN DURING THIS VISIT:  Medications  sodium chloride 0.9 % bolus 2,000 mL (not administered)  nicotine (NICODERM CQ - dosed in mg/24 hours) patch 21 mg (not administered)  dextrose 5 %-0.45 % sodium chloride infusion (not administered)  insulin regular bolus via infusion 0-10 Units (not administered)  insulin regular (NOVOLIN R,HUMULIN R) 100 Units in sodium chloride 0.9 % 100 mL (1 Units/mL) infusion (not administered)  dextrose 50 % solution 25 mL (not administered)  0.9 %  sodium  chloride infusion (not administered)  sodium chloride 0.9 % bolus 1,000 mL (1,000 mLs Intravenous New Bag/Given 12/27/16 0928)     NEW OUTPATIENT MEDICATIONS STARTED DURING THIS VISIT:  None   Note:  This document was prepared using  Dragon voice recognition software and may include unintentional dictation errors.  Alona Bene, MD Emergency Medicine   Liyanna Cartwright, Arlyss Repress, MD 12/27/16 [phone]

## 2016-12-27 NOTE — ED Notes (Signed)
CRITICAL VALUE ALERT  Critical Value:  Glucose 1087  Date & Time Notied:  12/27/17  Provider Notified: Dr. Jacqulyn BathLong  Orders Received/Actions taken: MD notified

## 2016-12-27 NOTE — ED Triage Notes (Signed)
Patient complaining of cough, shortness of breath, vomiting x 1, sore throat and generalized weakness since yesterday. States "I gave plasma on Sunday then went swimming in really cold water."

## 2016-12-27 NOTE — ED Notes (Signed)
CRITICAL VALUE ALERT  Critical Value:  Serum Osmolality  Date & Time Notied:  12/27/16 1256  Provider Notified: Dr. York PellantMemom  Orders Received/Actions taken: MD notified

## 2016-12-28 DIAGNOSIS — Z720 Tobacco use: Secondary | ICD-10-CM

## 2016-12-28 DIAGNOSIS — R739 Hyperglycemia, unspecified: Secondary | ICD-10-CM

## 2016-12-28 DIAGNOSIS — E871 Hypo-osmolality and hyponatremia: Secondary | ICD-10-CM

## 2016-12-28 DIAGNOSIS — N179 Acute kidney failure, unspecified: Secondary | ICD-10-CM

## 2016-12-28 LAB — COMPREHENSIVE METABOLIC PANEL
ALBUMIN: 2.7 g/dL — AB (ref 3.5–5.0)
ALT: 13 U/L — ABNORMAL LOW (ref 17–63)
ANION GAP: 5 (ref 5–15)
AST: 16 U/L (ref 15–41)
Alkaline Phosphatase: 50 U/L (ref 38–126)
BUN: 7 mg/dL (ref 6–20)
CHLORIDE: 108 mmol/L (ref 101–111)
CO2: 25 mmol/L (ref 22–32)
Calcium: 7.9 mg/dL — ABNORMAL LOW (ref 8.9–10.3)
Creatinine, Ser: 0.88 mg/dL (ref 0.61–1.24)
GFR calc non Af Amer: >60 mL/min (ref >60)
GLUCOSE: 254 mg/dL — AB (ref 65–99)
Potassium: 4.1 mmol/L (ref 3.5–5.1)
SODIUM: 138 mmol/L (ref 135–145)
Total Bilirubin: 0.8 mg/dL (ref 0.3–1.2)
Total Protein: 4.3 g/dL — ABNORMAL LOW (ref 6.5–8.1)

## 2016-12-28 LAB — GLUCOSE, CAPILLARY
GLUCOSE-CAPILLARY: 247 mg/dL — AB (ref 65–99)
GLUCOSE-CAPILLARY: 312 mg/dL — AB (ref 65–99)
GLUCOSE-CAPILLARY: 220 mg/dL — AB (ref 65–99)
Glucose-Capillary: 335 mg/dL — ABNORMAL HIGH (ref 65–99)

## 2016-12-28 LAB — CBC
HCT: 36.6 % — ABNORMAL LOW (ref 39.0–52.0)
HEMOGLOBIN: 13.1 g/dL (ref 13.0–17.0)
MCH: 31.1 pg (ref 26.0–34.0)
MCHC: 35.8 g/dL (ref 30.0–36.0)
MCV: 86.9 fL (ref 78.0–100.0)
PLATELETS: 133 10*3/uL — AB (ref 150–400)
RBC: 4.21 MIL/uL — ABNORMAL LOW (ref 4.22–5.81)
RDW: 12.9 % (ref 11.5–15.5)
WBC: 5.3 10*3/uL (ref 4.0–10.5)

## 2016-12-28 LAB — BASIC METABOLIC PANEL
Anion gap: 8 (ref 5–15)
BUN: 8 mg/dL (ref 6–20)
CHLORIDE: 106 mmol/L (ref 101–111)
CO2: 25 mmol/L (ref 22–32)
CREATININE: 0.94 mg/dL (ref 0.61–1.24)
Calcium: 8 mg/dL — ABNORMAL LOW (ref 8.9–10.3)
GFR calc non Af Amer: >60 mL/min (ref >60)
Glucose, Bld: 205 mg/dL — ABNORMAL HIGH (ref 65–99)
POTASSIUM: 3.1 mmol/L — AB (ref 3.5–5.1)
SODIUM: 139 mmol/L (ref 135–145)

## 2016-12-28 LAB — HIV ANTIBODY (ROUTINE TESTING W REFLEX): HIV SCREEN 4TH GENERATION: NONREACTIVE

## 2016-12-28 LAB — HEMOGLOBIN A1C
HEMOGLOBIN A1C: 13.3 % — AB (ref 4.8–5.6)
MEAN PLASMA GLUCOSE: 335 mg/dL

## 2016-12-28 MED ORDER — INSULIN STARTER KIT- SYRINGES (ENGLISH)
1.0000 | Freq: Once | Status: AC
  Administered 2016-12-29: 1
  Filled 2016-12-28 (×2): qty 1

## 2016-12-28 MED ORDER — TRAMADOL HCL 50 MG PO TABS
50.0000 mg | ORAL_TABLET | Freq: Four times a day (QID) | ORAL | Status: DC | PRN
  Administered 2016-12-28: 50 mg via ORAL
  Filled 2016-12-28: qty 1

## 2016-12-28 MED ORDER — IBUPROFEN 400 MG PO TABS
400.0000 mg | ORAL_TABLET | Freq: Four times a day (QID) | ORAL | Status: DC | PRN
  Administered 2016-12-28: 400 mg via ORAL
  Filled 2016-12-28: qty 1

## 2016-12-28 MED ORDER — NICOTINE 21 MG/24HR TD PT24
21.0000 mg | MEDICATED_PATCH | Freq: Every day | TRANSDERMAL | Status: DC
  Administered 2016-12-28: 21 mg via TRANSDERMAL
  Filled 2016-12-28 (×2): qty 1

## 2016-12-28 MED ORDER — ALUM & MAG HYDROXIDE-SIMETH 200-200-20 MG/5ML PO SUSP
30.0000 mL | ORAL | Status: DC | PRN
  Administered 2016-12-28 (×2): 30 mL via ORAL
  Filled 2016-12-28 (×2): qty 30

## 2016-12-28 MED ORDER — LIVING WELL WITH DIABETES BOOK
Freq: Once | Status: AC
Start: 2016-12-28 — End: 2016-12-28
  Administered 2016-12-28: 1
  Filled 2016-12-28: qty 1

## 2016-12-28 NOTE — Progress Notes (Addendum)
Inpatient Diabetes Program Recommendations  AACE/ADA: New Consensus Statement on Inpatient Glycemic Control (2015)  Target Ranges:  Prepandial:   less than 140 mg/dL      Peak postprandial:   less than 180 mg/dL (1-2 hours)      Critically ill patients:  140 - 180 mg/dL  Results for ZAIM, NITTA (MRN [id number]) as of 12/28/2016 09:51  Ref. Range 12/27/2016 10:59 12/27/2016 12:07 12/27/2016 13:22 12/27/2016 14:25 12/27/2016 15:28 12/27/2016 16:42 12/27/2016 17:28 12/27/2016 18:33 12/27/2016 19:48 12/27/2016 20:53 12/27/2016 22:04 12/27/2016 22:53 12/28/2016 07:53  Glucose-Capillary Latest Ref Range: 65 - 99 mg/dL >600 (HH) 501 (HH) 346 (H) 291 (H) 248 (H) 185 (H) 268 (H) 222 (H) 166 (H) 116 (H) 97 90 247 (H)   Results for LOU, LOEWE (MRN [id number]) as of 12/28/2016 09:51  Ref. Range 12/27/2016 09:02 12/27/2016 16:11 12/27/2016 17:28 12/27/2016 23:59 12/28/2016 05:57  Glucose Latest Ref Range: 65 - 99 mg/dL 1,087 (HH) 244 (H)  205 (H) 254 (H)  Hemoglobin A1C Latest Ref Range: 4.8 - 5.6 %   13.3 (H)     Review of Glycemic Control  Diabetes history: No Outpatient Diabetes medications: NA Current orders for Inpatient glycemic control: Lantus 10 units QHS, Novolog 0-9 units TID with meals, Novolog 5 units TID with meals for meal coverage  Inpatient Diabetes Program Recommendations: Insulin - Basal: Due to insulin cost, recommend discontinuing Lantus and ordering 70/30 13 units BID starting with supper today (dose will provide a total of 18 units for basal and 8 units for meal coverage per day). Insulin-Meal Coverage: If 70/30 is ordered as recommended, please consider discontinuing ordered meal coverage. HgbA1C: A1C 13.3% on 12/27/16 indicating an average glucose of 335 mg/dl over the past 2-3 months. Outpatient Referral: Will need to establish care with PCP for follow up.  NOTE: Patient newly dx with DM. Initial glucose 1087 mg/dl and A1C 13.3% on 12/27/16. Note patient has no insurance or PCP. If  patient will discharge on insulin, recommend using NOVOLIN 70/30 since it can be purchased at [facility] for $25 per vial. Please consider discontinuing Lantus and ordered meal coverage and ordering 70/30 11 units BID with meals starting with supper today. Ordered: Living Well with Diabetes booklet, RD consult for diet education, CM consult for follow up and perhaps medication assistance, insulin starter kit (vial/syringe), and patient education on DM by bedside RNs. Will talk with patient today.  Addendum 12/28/16@13 :45-Spoke with patient (over phone; diabetes coordinator not on [facility] campus today) about new diabetes diagnosis. Patient reports that nursing staff has already began education about DM and insulin and he has already drawn up insulin in syringe and self-administered insulin.  Discussed A1C results (13.3%) and explained what an A1C is and informed patient that his current A1C indicates an average glucose of 335 mg/dl over the past 2-3 months. Discussed basic pathophysiology of DM Type 1 and 2, basic home care, importance of checking CBGs and maintaining good CBG control to prevent long-term and short-term complications. Reviewed glucose and A1C goals.   Reviewed signs and symptoms of hyperglycemia and hypoglycemia along with treatment for both. Discussed impact of nutrition, exercise, stress, sickness, and medications on diabetes control. Patient reports that the RD has already talked with him about Carb Modified diet.  Patient states he has the Living Well with diabetes booklet but has not had an opportunity to start reading it yet. Encouraged patient to read through entire book and ask RN if he has any questions. Discussed current insulin  regimen with Lantus and Novolog insulin. Patient currently does not have any insurance. Explained that due to cost of Lantus (approximately $200 per vial) and Novolog (approximately $300 per vial), would recommend using generic insulins from [facility] (NOVOLIN N, NOVOLIN  R, NOVOLIN 70/30). Informed patient that NOVOLIN 70/30 can be purchased at [facility] for $25 per vial. Also encouraged patient to go to [facility] to get the Reli-On Prime glucometer for $9 and a box of 50 Reli-On test strips for $9. Discussed 70/30 insulin in detail (how to take it, when to take it). Asked patient to check his glucose at least 4 times per day (before meals and at bedtime) and to keep a log book of glucose readings and insulin taken. Patient does not have a PCP. Informed patient of consult for CM to assist with follow up care.  Explained how the doctor he follows up with can use the log book to continue to make insulin adjustments if needed Encouraged patient to continue to self-administer insulin while inpatient to ensure proper technique and ability to administer self insulin shots.   Patient verbalized understanding of information discussed and he states that he has no further questions at this time related to diabetes.   RNs to provide ongoing basic DM education at bedside with this patient and engage patient to actively check blood glucose and administer insulin injections.   Thanks, Barnie Alderman, RN, MSN, CDE Diabetes Coordinator Inpatient Diabetes Program [phone] (Team Pager from 8am to 5pm)

## 2016-12-28 NOTE — Progress Notes (Signed)
PROGRESS NOTE    DJIMON LUNDSTROM  ZOX:[id number] DOB: [birth year] DOA: 12/27/2016 PCP: Patient, No Pcp Per    Brief Narrative:  36 year old male who presented with a chief complaint of weakness. Patient with no significant past medical history who presented with 2 weeks of polyuria, polydipsia, weight loss. On the day of admission he noticed to be severely weak, presented to the hospital for further evaluation. On initial physical examination blood pressure 115/82, heart rate 70, respiratory rate 18, temperature 97.6, oxygen saturation 100%. His mucous membranes were moist, his lungs were clear to auscultation bilaterally, heart S1-S2 present rhythmic, abdomen was soft nontender, no lower extremity edema. Serum chemistry glucose in the thousands with negative anionic gap.  Admitted to the stepdown unit with the working diagnosis of hyperosmolar nonketotic state, related to new onset diabetes mellitus, type II.   Assessment & Plan:   Active Problems:   Diabetic hyperosmolar non-ketotic state (HCC)   Hyponatremia   Oral thrush   AKI (acute kidney injury) (HCC)   Tobacco abuse   1. Hyperosmolar non ketotic state. Will continue glucose cover and monitoring, continue insulin sliding scale for glucose cover and monitoring, basal rate of insulin with 10 units of glargine and pre-meal 5 units. Will plan to calculate insulin requirements over the next 24 hours and change to insulin 70/30 at discharge, to allow patient's access to insulin. Diabetic teaching, will need glucometer.  2. Acute on chronic back pain. Will continue pain control with acetaminophen, will add ibuprofen and tramadol, will avoid narcotics.   3. Hypokalemia. Due to insulin therapy, serum k up to 4,1, will follow on renal panel in am, avoid hypotension and nephrotoxic medications, renal function is preserved with serum cr at 0,88. No anion gap, and serum bicarbonate 25.  4. Tobacco abuse. Will add nicotine patch.    DVT  prophylaxis: enoxaparin  Code Status: full  Family Communication:  Disposition Plan: home    Consultants:     Procedures:    Antimicrobials:     Subjective: Positive back pain, moderate to severe, worse with movement, no nausea or vomiting, tolerating po well, no chest pain or dyspnea.   Objective: Vitals:   12/28/16 0200 12/28/16 0300 12/28/16 0500 12/28/16 0600  BP: 103/64 (!) 91/56 93/61 114/75  Pulse: 67 67 61 67  Resp: 20 (!) 21 19 14   Temp:      TempSrc:      SpO2: 98% 98% 98% 100%  Weight:      Height:        Intake/Output Summary (Last 24 hours) at 12/28/16 0809 Last data filed at 12/28/16 0600  Gross per 24 hour  Intake          6680.77 ml  Output                0 ml  Net          6680.77 ml   Filed Weights   12/27/16 0839 12/27/16 1502  Weight: 78 kg (172 lb) 76.4 kg (168 lb 6.9 oz)    Examination:  General exam: not in pain or dyspnea E ENT: mild pallor, no icterus, oral mucosa moist. Respiratory system: No wheezing, rales or rhonchi.  Respiratory effort normal. Cardiovascular system: S1 & S2 heard, RRR. No JVD, murmurs, rubs, gallops or clicks. No pedal edema. Gastrointestinal system: Abdomen is nondistended, soft and nontender. No organomegaly or masses felt. Normal bowel sounds heard. Central nervous system: Alert and oriented. No focal neurological deficits. Extremities: Symmetric  5 x 5 power. Skin: No rashes, lesions or ulcers     Data Reviewed: I have personally reviewed following labs and imaging studies  CBC:  Recent Labs Lab 12/27/16 0902 12/28/16 0557  WBC 4.6 5.3  NEUTROABS 3.4  --   HGB 15.7 13.1  HCT 44.0 36.6*  MCV 86.6 86.9  PLT 154 133*   Basic Metabolic Panel:  Recent Labs Lab 12/27/16 0902 12/27/16 1611 12/27/16 1700 12/27/16 2359 12/28/16 0557  NA 125* 139  --  139 138  K 3.8 2.6*  --  3.1* 4.1  CL 87* 104  --  106 108  CO2 27 28  --  25 25  GLUCOSE 1,087* 244*  --  205* 254*  BUN 10 8  --  8 7    CREATININE 1.39* 0.98  --  0.94 0.88  CALCIUM 8.6* 8.1*  --  8.0* 7.9*  MG  --   --  2.0  --   --    GFR: Estimated Creatinine Clearance: 129 mL/min (by C-G formula based on SCr of 0.88 mg/dL). Liver Function Tests:  Recent Labs Lab 12/27/16 0902 12/28/16 0557  AST 17 16  ALT 16* 13*  ALKPHOS 144* 50  BILITOT 1.3* 0.8  PROT 6.1* 4.3*  ALBUMIN 4.0 2.7*    Recent Labs Lab 12/27/16 1742  LIPASE 77*   No results for input(s): AMMONIA in the last 168 hours. Coagulation Profile: No results for input(s): INR, PROTIME in the last 168 hours. Cardiac Enzymes:  Recent Labs Lab 12/27/16 1742  TROPONINI <0.03   BNP (last 3 results) No results for input(s): PROBNP in the last 8760 hours. HbA1C:  Recent Labs  12/27/16 1728  HGBA1C 13.3*   CBG:  Recent Labs Lab 12/27/16 1948 12/27/16 2053 12/27/16 2204 12/27/16 2253 12/28/16 0753  GLUCAP 166* 116* 97 90 247*   Lipid Profile: No results for input(s): CHOL, HDL, LDLCALC, TRIG, CHOLHDL, LDLDIRECT in the last 72 hours. Thyroid Function Tests: No results for input(s): TSH, T4TOTAL, FREET4, T3FREE, THYROIDAB in the last 72 hours. Anemia Panel: No results for input(s): VITAMINB12, FOLATE, FERRITIN, TIBC, IRON, RETICCTPCT in the last 72 hours. Sepsis Labs: No results for input(s): PROCALCITON, LATICACIDVEN in the last 168 hours.  Recent Results (from the past 240 hour(s))  Rapid strep screen     Status: None   Collection Time: 12/27/16  9:16 AM  Result Value Ref Range Status   Streptococcus, Group A Screen (Direct) NEGATIVE NEGATIVE Final    Comment: (NOTE) A Rapid Antigen test may result negative if the antigen level in the sample is below the detection level of this test. The FDA has not cleared this test as a stand-alone test therefore the rapid antigen negative result has reflexed to a Group A Strep culture.   MRSA PCR Screening     Status: None   Collection Time: 12/27/16  2:54 PM  Result Value Ref Range  Status   MRSA by PCR NEGATIVE NEGATIVE Final    Comment:        The GeneXpert MRSA Assay (FDA approved for NASAL specimens only), is one component of a comprehensive MRSA colonization surveillance program. It is not intended to diagnose MRSA infection nor to guide or monitor treatment for MRSA infections.          Radiology Studies: Dg Chest 2 View  Result Date: 12/27/2016 CLINICAL DATA:  Shortness of breath with cough EXAM: CHEST  2 VIEW COMPARISON:  January 03, 2011 FINDINGS: There is  no edema or consolidation. The heart size and pulmonary vascularity are within normal limits. No adenopathy. No pneumothorax. No bone lesions. IMPRESSION: No edema or consolidation. Electronically Signed   By: Bretta Bang III M.D.   On: 12/27/2016 09:52        Scheduled Meds: . enoxaparin (LOVENOX) injection  40 mg Subcutaneous Q24H  . insulin aspart  0-9 Units Subcutaneous TID WC  . insulin aspart  5 Units Subcutaneous TID WC  . insulin glargine  10 Units Subcutaneous QHS  . nicotine  21 mg Transdermal Once  . nystatin  5 mL Oral QID   Continuous Infusions: . 0.9 % NaCl with KCl 40 mEq / L 125 mL/hr (12/28/16 0128)     LOS: 0 days        Arianny Pun Annett Gula, MD [facility] Pager [phone]  If 7PM-7AM, please contact night-coverage [email or web address] Password [email or web address] 12/28/2016, 8:09 AM

## 2016-12-28 NOTE — Care Management Note (Signed)
Case Management Note  Patient Details  Name: Elijah GuadalajaraRobert B Macdougal MRN: [id number] Date of Birth: 1981-02-25  Subjective/Objective:  Adm with new onset DM. He is working but does not have insurance yet. He has been counseled by dietician and diabetes coordinator. [facility]. I supplied prescription for CBG meter. Patient reports he can afford medications as he will be eligible for insurance in 3 months. [facility]               Action/Plan: Plans to return home with self care and f/u with Health dept. Appointment date/time given to patient as well as added to Discharge AVS.   Expected Discharge Date:     12/29/2016             Expected Discharge Plan:  Home/Self Care  In-House Referral:     Discharge planning Services  CM Consult, Medication Assistance  Post Acute Care Choice:  NA Choice offered to:  NA  DME Arranged:    DME Agency:     HH Arranged:    HH Agency:     Status of Service:  Completed, signed off  If discussed at [employer] Length of Stay Meetings, dates discussed:    Additional Comments:  Warnie Belair, Chrystine OilerSharley Diane, RN 12/28/2016, 3:02 PM

## 2016-12-28 NOTE — Progress Notes (Signed)
Reported to M.Howerton, RN on Dept. 300, transported to 334 in stable condition via w/c.

## 2016-12-28 NOTE — Plan of Care (Addendum)
Problem: Food- and Nutrition-Related Knowledge Deficit (NB-1.1) Goal: Nutrition education Formal process to instruct or train a patient/client in a skill or to impart knowledge to help patients/clients voluntarily manage or modify food choices and eating behavior to maintain or improve health. Outcome: Adequate for Discharge  RD consulted for nutrition education regarding new onset diabetes. He states "I dont eat sweets, I dont know what happened".  Lab Results  Component Value Date   HGBA1C 13.3 (H) 12/27/2016   RD went through dietary recall.   Breakfast: Typically skips. If does eat, he will eat sausage, egg, cheese biscuit. On weekends, they make bacon, eggs, cheese sausage as family Lunch: Fast Food: burgers, fries, tacos Dinner: Typically home cooked meals, but much of same type of food as lunch: [place] cheese steaks, subs, burgers, fries, steak Beverages: Soda. He says that he has been drinking 1-1.5 liters of regular soda a day, however this is more than typical due to polydipsia.  Other: Eats potatoes with every meal. Eats large quantities of ketchup with his fries. When made at home, dips fries in mayo/milk concoction. Eats dense grains in forms of subs  RD provided "Diabetes Type 2 Nutrition Therapy"  And "Diabetes Label Reading Tips" handouts from the Academy of Nutrition and Dietetics, as well as a copy of the Diabetic "My Plate" Template.  He was drinking 1-1.5 Liters of regular soda per day. First and foremost, his soda consumption is leading cause of his profound hyperglycemia. We discussed how sugary beverages affect the body and that they are the worst thing he can consume in terms of diabetes.  He says he has made the conscious descision to give them up completely. He also had been drinking gatorade and Hi C juice, "trying to stay hydrated". We discussed how juice and soda will have near identical affects on BG and he should also eliminate or cut back on juice.   He consumes  a high quantity of starchy vegetables. He has potatoes, in some form or another, with every meal. He says he was eating a lot of fries "to help increase my proteins for plasma donation". We discussed what a starchy vegetable was and which foods were classified as such. He eats corn frequently as well. Some true vegetables he likes are asparagus, carrots, green beans. Asked him to eat these much more frequently.   In addition to his starchy vegetables, he eats a lot of bread. RD showed him the My Plate template and discussed the ideal food group ratios he should consume at each meal for optimal glycemic control. His bread would fall into the carbohydrate section, which should only make up for 25% of his meal. When he eats his dense subs, this becomes greater than 25%. He should cut back on these sandwiches.   He eats a large quantity of kechup. He says he eats ketchup with all his fries. When his fries are at home. He dips them in a mixture of mayo and milk.  Explained how condiments typically have added sugar and ketchup, in particular, is extremely high in added sugar.  RD showed him a picture of a label and highlighted the pertinent aspects he should look for. Namely, he should choose items high in protein/fiber and low in Total carbs/added sugar. Also, made sure he knows to acknowledge the portion size. Recommended he aim for ~80 carbs per meal.   Patients more in depth education and Did not have time to discuss many topics. Did not cover carb counting or  meal frequency importance. -will refer to outpatient ed  Summary of Reccomendations  1. Eliminate or drastically reduce soda consumption (he says he will stop completely) 2. Drastically reduce potato intake/portion size-switch for TRUE vegetables 3. Make 75% of meal protein/veg. 25% carb 4. Reduce bread intake 5. Read labels-Aim for 80 g carb/meal. Watch portion size! 6. No more than 1 packet ketchup per meal  Expect fair compliance. Though  patient is motivated. He has a VERY grass roots understanding of diabetes and the diet and will need continual reinforcement on diet.   Body mass index is 22.84 kg/m. Pt meets criteria for Healthy wt for ht based on current BMI.  Labs and medications reviewed. No further nutrition interventions warranted at this time. RD referred to patient to outpatient DM education at [facility]. If additional nutrition issues arise, please re-consult RD.  Christophe Louis RD, LDN, CNSC Clinical Nutrition Pager: [phone] 12/28/2016 11:00 AM

## 2016-12-28 NOTE — Progress Notes (Signed)
Education provided to patient about insulin administration, checking blood glucose.  Instructed on checking blood glucose, return demonstrated; instructed on drawing up insulin into the syringe and administering into subcutaneous tissue, return demonstrated; instructed to rotate administration sites, verbalized understanding.  Living well with diabetes booklet given and topics briefly discussed, patient verbalized he will read the book and if he has questions the nurse will be notified.

## 2016-12-29 DIAGNOSIS — E1100 Type 2 diabetes mellitus with hyperosmolarity without nonketotic hyperglycemic-hyperosmolar coma (NKHHC): Secondary | ICD-10-CM

## 2016-12-29 DIAGNOSIS — B370 Candidal stomatitis: Secondary | ICD-10-CM

## 2016-12-29 LAB — BASIC METABOLIC PANEL
Anion gap: 6 (ref 5–15)
BUN: 10 mg/dL (ref 6–20)
CHLORIDE: 104 mmol/L (ref 101–111)
CO2: 27 mmol/L (ref 22–32)
CREATININE: 0.98 mg/dL (ref 0.61–1.24)
Calcium: 8.2 mg/dL — ABNORMAL LOW (ref 8.9–10.3)
GFR calc Af Amer: >60 mL/min (ref >60)
GFR calc non Af Amer: >60 mL/min (ref >60)
Glucose, Bld: 369 mg/dL — ABNORMAL HIGH (ref 65–99)
Potassium: 4 mmol/L (ref 3.5–5.1)
Sodium: 137 mmol/L (ref 135–145)

## 2016-12-29 LAB — GLUCOSE, CAPILLARY
GLUCOSE-CAPILLARY: 377 mg/dL — AB (ref 65–99)
Glucose-Capillary: 277 mg/dL — ABNORMAL HIGH (ref 65–99)
Glucose-Capillary: 337 mg/dL — ABNORMAL HIGH (ref 65–99)

## 2016-12-29 LAB — CULTURE, GROUP A STREP (THRC)

## 2016-12-29 MED ORDER — INSULIN ASPART PROT & ASPART (70-30 MIX) 100 UNIT/ML ~~LOC~~ SUSP: 15.0000 [IU] | Freq: Two times a day (BID) | SUBCUTANEOUS | 11 refills | Status: DC

## 2016-12-29 MED ORDER — INSULIN ASPART PROT & ASPART (70-30 MIX) 100 UNIT/ML ~~LOC~~ SUSP
15.0000 [IU] | Freq: Two times a day (BID) | SUBCUTANEOUS | Status: DC
  Filled 2016-12-29: qty 10

## 2016-12-29 MED ORDER — INSULIN STARTER KIT- SYRINGES (ENGLISH): 1.0000 | Freq: Once | 0 refills | Status: AC

## 2016-12-29 MED ORDER — NYSTATIN 100000 UNIT/ML MT SUSP: 5.0000 mL | Freq: Four times a day (QID) | OROMUCOSAL | 0 refills | Status: AC

## 2016-12-29 MED ORDER — BLOOD GLUCOSE METER KIT: PACK | 0 refills | Status: AC

## 2016-12-29 NOTE — Progress Notes (Signed)
Inpatient Diabetes Program Recommendations  AACE/ADA: New Consensus Statement on Inpatient Glycemic Control (2015)  Target Ranges:  Prepandial:   less than 140 mg/dL      Peak postprandial:   less than 180 mg/dL (1-2 hours)      Critically ill patients:  140 - 180 mg/dL   Results for Ronnell GuadalajaraHUFFMAN, Dennys B (MRN [id number]) as of 12/29/2016 07:43  Ref. Range 12/28/2016 07:53 12/28/2016 11:30 12/28/2016 16:49 12/28/2016 21:26 12/29/2016 06:21  Glucose-Capillary Latest Ref Range: 65 - 99 mg/dL 409247 (H) 811312 (H) 914335 (H) 220 (H) 277 (H)   Review of Glycemic Control Diabetes history: No Outpatient Diabetes medications: NA Current orders for Inpatient glycemic control: Lantus 10 units QHS, Novolog 0-9 units TID with meals, Novolog 5 units TID with meals for meal coverage  Inpatient Diabetes Program Recommendations: Insulin - Basal: Due to insulin cost, recommend discontinuing Lantus and ordering 70/30 15 units BID (dose will provide a total of 21 units for basal and 9 units for meal coverage per day). Insulin-Meal Coverage: If 70/30 is ordered as recommended, please consider discontinuing ordered meal coverage. HgbA1C: A1C 13.3% on 12/27/16 indicating an average glucose of 335 mg/dl over the past 2-3 months. Outpatient Referral: Will need to establish care with PCP for follow up. Recommend discharging patient on NOVOLIN 70/30 insulin.  Thanks, Orlando PennerMarie Cobie Leidner, RN, MSN, CDE Diabetes Coordinator Inpatient Diabetes Program [phone] (Team Pager from 8am to 5pm)

## 2016-12-29 NOTE — Discharge Summary (Addendum)
Physician Discharge Summary  Elijah Miller DPO:[id number] DOB: [birth year] DOA: 12/27/2016  PCP: Patient, No Pcp Per  Admit date: 12/27/2016 Discharge date: 12/29/2016  Admitted From: Home  Disposition:  Home   Recommendations for Outpatient Follow-up:  1. Follow up with PCP in 1- week 2. Patient placed on insulin 70/30  3. Diabetic diet 4.          Check capillary glucose before meals and keep a log  Home Health: no  Equipment/Devices: glucometer  Discharge Condition: Stable  CODE STATUS: Full  Diet recommendation: Carb Modified   Brief/Interim Summary: 36 year old male who presented with a chief complaint of weakness. Patient with no significant past medical history who presented with 2 weeks of polyuria, polydipsia, weight loss. On the day of admission he noticed to be severely weak, presented to the hospital for further evaluation. On initial physical examination blood pressure 115/82, heart rate 70, respiratory rate 18, temperature 97.6, oxygen saturation 100%. His mucous membranes were moist, his lungs were clear to auscultation bilaterally, heart S1-S2 present rhythmic, abdomen was soft nontender, no lower extremity edema. Sodium 125, potassium 3.8, chloride 87, bicarbonate 27, glucose 1087, BUN 10, creatinine 1.39, anion gap 11, AST 17, ALT 16, white count 4.6, hemoglobin 15.7, hematocrit 44.0, platelet 154, serum osmolality 327, chest x-ray negative for infiltrates. EKG normal sinus rhythm, lateral repolarization changes.  Admitted to the stepdown unit with the working diagnosis of hyperosmolar nonketotic state, related to new onset diabetes mellitus, type II, complicated by acute kidney injury  1. Hyperosmolar nonketotic state. Patient was admitted to the stepdown unit, he was placed on intravenous insulin with improvement of his hyperglycemia, he was transitioned successfully to subcutaneous insulin. Discharged on insulin 70/ 30, 15 units twice daily, instructed to check  capillary glucose before meals and keep a log. Follow-up appointment within 7 days. He received diabetic teaching.  2. New onset diabetes mellitus type 2. Patient had enough endogenous insulin to prevent ketosis, but he did develop severe hyperglycemia with hyperosmolar state, patient will be discharged on insulin therapy and a close follow-up as an outpatient. He received diabetic teaching. Social services have been consulted in order to patient have access to outpatient insulin.  3. Acute kidney injury with hypokalemia and pseudohyponatremia. Patient was placed on isotonic solutions intravenously with improvement of kidney function discharge creatinine 0.98. Potassium was repleted with potassium chloride, discharge potassium 4.0. The serum sodium corrected along with correction of serum glucose.   4. Acute on chronic lower back pain. Patient received ibuprofen for pain control along with tramadol, will recommend to avoid narcotics.  5. Tobacco abuse. Smoking cessation. Patient received nicotine patch during his hospitalization  6. Oral thrush. Will continue nystatin for next 10 days  Discharge Diagnoses:  Active Problems:   Diabetic hyperosmolar non-ketotic state ([place])   Hyponatremia   Oral thrush   AKI (acute kidney injury) ([place])   Tobacco abuse    Discharge Instructions  Discharge Instructions    Ambulatory Referral to DSME/T    Complete by:  As directed    Please refer to [facility]   Choose type of training services and number of hours requested:  Initial DSME/T:  enter hours in comments   Check all special needs that apply to patient requiring 1 on 1 DSME/T:  Economic/Supplies Comment - No insurance   DSME/T Content:  Nutritional management     Allergies as of 12/29/2016   No Known Allergies     Medication List    STOP  taking these medications   NYQUIL PO     TAKE these medications   blood glucose meter kit and supplies Dispense based on patient and insurance  preference. Use up to four times daily as directed. (FOR ICD-9 250.00, 250.01).   insulin aspart protamine- aspart (70-30) 100 UNIT/ML injection Commonly known as:  NOVOLOG MIX 70/30 Inject 0.15 mLs (15 Units total) into the skin 2 (two) times daily with a meal.   insulin starter kit- syringes Misc 1 kit by Other route once.   nystatin 100000 UNIT/ML suspension Commonly known as:  MYCOSTATIN Take 5 mLs (500,000 Units total) by mouth 4 (four) times daily.      Follow-up Information    [facility] Follow up.   Why:  Appointment July 26th at 8:30, bring proof of income and photo ID, will be resposible for 40% of the bill. If you need to cancel, please call and notify Health dept.  Contact information: [address] [place] [address] [phone]          No Known Allergies  Consultations:   Procedures/Studies: Dg Chest 2 View  Result Date: 12/27/2016 CLINICAL DATA:  Shortness of breath with cough EXAM: CHEST  2 VIEW COMPARISON:  January 03, 2011 FINDINGS: There is no edema or consolidation. The heart size and pulmonary vascularity are within normal limits. No adenopathy. No pneumothorax. No bone lesions. IMPRESSION: No edema or consolidation. Electronically Signed   By: Lowella Grip III M.D.   On: 12/27/2016 09:52      Subjective: Patient is feeling better, no nausea or vomiting, tolerating po well.   Discharge Exam: Vitals:   12/28/16 2131 12/29/16 0616  BP: 132/86 120/80  Pulse: 85 85  Resp: 20 18  Temp: 97.9 F (36.6 C) 97.8 F (36.6 C)   Vitals:   12/28/16 1200 12/28/16 1524 12/28/16 2131 12/29/16 0616  BP:  119/82 132/86 120/80  Pulse:  84 85 85  Resp:  _0 Temp: 98.2 F (36.8 C)  97.9 F (36.6 C) 97.8 F (36.6 C)  TempSrc: Oral  Oral Oral  SpO2:  99% 97% 99%  Weight:      Height:        General: Pt is alert, awake, not in acute distress E ENT: no pallor or icterus, oral mucosa moist.  Cardiovascular: RRR, S1/S2 +,  no rubs, no gallops Respiratory: CTA bilaterally, no wheezing, no rhonchi Abdominal: Soft, NT, ND, bowel sounds + Extremities: no edema, no cyanosis    The results of significant diagnostics from this hospitalization (including imaging, microbiology, ancillary and laboratory) are listed below for reference.     Microbiology: Recent Results (from the past 240 hour(s))  Rapid strep screen     Status: None   Collection Time: 12/27/16  9:16 AM  Result Value Ref Range Status   Streptococcus, Group A Screen (Direct) NEGATIVE NEGATIVE Final    Comment: (NOTE) A Rapid Antigen test may result negative if the antigen level in the sample is below the detection level of this test. The FDA has not cleared this test as a stand-alone test therefore the rapid antigen negative result has reflexed to a Group A Strep culture.   Culture, group A strep     Status: None (Preliminary result)   Collection Time: 12/27/16  9:16 AM  Result Value Ref Range Status   Specimen Description THROAT  Final   Special Requests NONE Reflexed from [id number]  Final   Culture  Final    CULTURE REINCUBATED FOR BETTER GROWTH Performed at [facility], [place] [address] [place], [place] [address]    Report Status PENDING  Incomplete  MRSA PCR Screening     Status: None   Collection Time: 12/27/16  2:54 PM  Result Value Ref Range Status   MRSA by PCR NEGATIVE NEGATIVE Final    Comment:        The GeneXpert MRSA Assay (FDA approved for NASAL specimens only), is one component of a comprehensive MRSA colonization surveillance program. It is not intended to diagnose MRSA infection nor to guide or monitor treatment for MRSA infections.      Labs: BNP (last 3 results) No results for input(s): BNP in the last 8760 hours. Basic Metabolic Panel:  Recent Labs Lab 12/27/16 0902 12/27/16 1611 12/27/16 1700 12/27/16 2359 12/28/16 0557 12/29/16 0420  NA 125* 139  --  139 138 137  K 3.8 2.6*  --  3.1* 4.1 4.0   CL 87* 104  --  106 108 104  CO2 27 28  --  _0 GLUCOSE 1,087* 244*  --  205* 254* 369*  BUN 10 8  --  _1 CREATININE 1.39* 0.98  --  0.94 0.88 0.98  CALCIUM 8.6* 8.1*  --  8.0* 7.9* 8.2*  MG  --   --  2.0  --   --   --    Liver Function Tests:  Recent Labs Lab 12/27/16 0902 12/28/16 0557  AST 17 16  ALT 16* 13*  ALKPHOS 144* 50  BILITOT 1.3* 0.8  PROT 6.1* 4.3*  ALBUMIN 4.0 2.7*    Recent Labs Lab 12/27/16 1742  LIPASE 77*   No results for input(s): AMMONIA in the last 168 hours. CBC:  Recent Labs Lab 12/27/16 0902 12/28/16 0557  WBC 4.6 5.3  NEUTROABS 3.4  --   HGB 15.7 13.1  HCT 44.0 36.6*  MCV 86.6 86.9  PLT 154 133*   Cardiac Enzymes:  Recent Labs Lab 12/27/16 1742  TROPONINI <0.03   BNP: Invalid input(s): POCBNP CBG:  Recent Labs Lab 12/28/16 1130 12/28/16 1649 12/28/16 2126 12/29/16 0621 12/29/16 0805  GLUCAP 312* 335* 220* 277* 337*   D-Dimer No results for input(s): DDIMER in the last 72 hours. Hgb A1c  Recent Labs  12/27/16 1728  HGBA1C 13.3*   Lipid Profile No results for input(s): CHOL, HDL, LDLCALC, TRIG, CHOLHDL, LDLDIRECT in the last 72 hours. Thyroid function studies No results for input(s): TSH, T4TOTAL, T3FREE, THYROIDAB in the last 72 hours.  Invalid input(s): FREET3 Anemia work up No results for input(s): VITAMINB12, FOLATE, FERRITIN, TIBC, IRON, RETICCTPCT in the last 72 hours. Urinalysis    Component Value Date/Time   COLORURINE YELLOW 10/26/2009 1250   APPEARANCEUR CLEAR 10/26/2009 1250   LABSPEC 1.025 10/26/2009 1250   PHURINE 6.5 10/26/2009 1250   GLUCOSEU NEGATIVE 10/26/2009 1250   HGBUR NEGATIVE 10/26/2009 1250   BILIRUBINUR NEGATIVE 10/26/2009 1250   KETONESUR NEGATIVE 10/26/2009 1250   PROTEINUR NEGATIVE 10/26/2009 1250   UROBILINOGEN 0.2 10/26/2009 1250   NITRITE NEGATIVE 10/26/2009 1250   LEUKOCYTESUR  10/26/2009 1250    NEGATIVE MICROSCOPIC NOT DONE ON URINES WITH NEGATIVE PROTEIN,  BLOOD, LEUKOCYTES, NITRITE, OR GLUCOSE <1000 mg/dL.   Sepsis Labs Invalid input(s): PROCALCITONIN,  WBC,  LACTICIDVEN Microbiology Recent Results (from the past 240 hour(s))  Rapid strep screen     Status: None   Collection Time: 12/27/16  9:16  AM  Result Value Ref Range Status   Streptococcus, Group A Screen (Direct) NEGATIVE NEGATIVE Final    Comment: (NOTE) A Rapid Antigen test may result negative if the antigen level in the sample is below the detection level of this test. The FDA has not cleared this test as a stand-alone test therefore the rapid antigen negative result has reflexed to a Group A Strep culture.   Culture, group A strep     Status: None (Preliminary result)   Collection Time: 12/27/16  9:16 AM  Result Value Ref Range Status   Specimen Description THROAT  Final   Special Requests NONE Reflexed from [id number]  Final   Culture   Final    CULTURE REINCUBATED FOR BETTER GROWTH Performed at [facility], [address], [place], [place] [address]    Report Status PENDING  Incomplete  MRSA PCR Screening     Status: None   Collection Time: 12/27/16  2:54 PM  Result Value Ref Range Status   MRSA by PCR NEGATIVE NEGATIVE Final    Comment:        The GeneXpert MRSA Assay (FDA approved for NASAL specimens only), is one component of a comprehensive MRSA colonization surveillance program. It is not intended to diagnose MRSA infection nor to guide or monitor treatment for MRSA infections.      Time coordinating discharge: 45 minutes  SIGNED:   Tawni Millers, MD  [facility] 12/29/2016, 9:38 AM Pager   If 7PM-7AM, please contact night-coverage [email or web address] Password [email or web address]

## 2016-12-29 NOTE — Progress Notes (Signed)
Pt's IV catheter removed and intact. Pt's IV site clean dry and intact. Discharge instructions including medications and follow up appointments were reviewed and discussed with patient. Pt verbalized understanding of discharge instructions. All questions were answered and no further questions at this time. Pt in stable condition and in no acute distress at time of discharge. Pt will be escorted by nurse tech.  

## 2016-12-30 LAB — GLUTAMIC ACID DECARBOXYLASE AUTO ABS: GLUTAMIC ACID DECARB AB: 305.3 U/mL — AB (ref 0.0–5.0)

## 2017-01-02 NOTE — Care Management (Signed)
Patient DC'd on 12/29/2016. Patient contacted CM on 01/02/2017 in need of MATCH voucher. Patient was prescribed Novolog 70/30  instead of Novolin 70/30 and can't afford medications. MATCH voucher given.

## 2017-01-05 LAB — INSULIN ANTIBODIES, BLOOD: Insulin Antibodies, Human: <5 uU/mL

## 2017-04-19 ENCOUNTER — Encounter (HOSPITAL_COMMUNITY): Payer: Self-pay | Admitting: Emergency Medicine

## 2017-04-19 ENCOUNTER — Emergency Department (HOSPITAL_COMMUNITY)
Admission: EM | Admit: 2017-04-19 | Discharge: 2017-04-19 | Disposition: A | Discharge status: Home / Self Care | Payer: Self-pay | Attending: Emergency Medicine | Admitting: Emergency Medicine

## 2017-04-19 ENCOUNTER — Other Ambulatory Visit: Payer: Self-pay

## 2017-04-19 DIAGNOSIS — R369 Urethral discharge, unspecified: Secondary | ICD-10-CM | POA: Insufficient documentation

## 2017-04-19 DIAGNOSIS — R300 Dysuria: Secondary | ICD-10-CM | POA: Insufficient documentation

## 2017-04-19 DIAGNOSIS — F17210 Nicotine dependence, cigarettes, uncomplicated: Secondary | ICD-10-CM | POA: Insufficient documentation

## 2017-04-19 DIAGNOSIS — R309 Painful micturition, unspecified: Secondary | ICD-10-CM | POA: Insufficient documentation

## 2017-04-19 DIAGNOSIS — Z794 Long term (current) use of insulin: Secondary | ICD-10-CM | POA: Insufficient documentation

## 2017-04-19 DIAGNOSIS — Z202 Contact with and (suspected) exposure to infections with a predominantly sexual mode of transmission: Secondary | ICD-10-CM | POA: Insufficient documentation

## 2017-04-19 DIAGNOSIS — E119 Type 2 diabetes mellitus without complications: Secondary | ICD-10-CM | POA: Insufficient documentation

## 2017-04-19 DIAGNOSIS — R102 Pelvic and perineal pain: Secondary | ICD-10-CM | POA: Insufficient documentation

## 2017-04-19 HISTORY — DX: Type 2 diabetes mellitus without complications: E11.9

## 2017-04-19 LAB — URINALYSIS, ROUTINE W REFLEX MICROSCOPIC
Bilirubin Urine: NEGATIVE
Glucose, UA: NEGATIVE mg/dL
Hgb urine dipstick: NEGATIVE
Ketones, ur: 20 mg/dL — AB
LEUKOCYTES UA: NEGATIVE
NITRITE: NEGATIVE
PROTEIN: NEGATIVE mg/dL
SPECIFIC GRAVITY, URINE: 1.02 (ref 1.005–1.030)
pH: 5 (ref 5.0–8.0)

## 2017-04-19 MED ORDER — LIDOCAINE HCL (PF) 1 % IJ SOLN
INTRAMUSCULAR | Status: AC
  Filled 2017-04-19: qty 2

## 2017-04-19 MED ORDER — AZITHROMYCIN 250 MG PO TABS
1000.0000 mg | ORAL_TABLET | Freq: Once | ORAL | Status: AC
  Administered 2017-04-19: 1000 mg via ORAL
  Filled 2017-04-19: qty 4

## 2017-04-19 MED ORDER — CEFTRIAXONE SODIUM 250 MG IJ SOLR
250.0000 mg | Freq: Once | INTRAMUSCULAR | Status: AC
  Administered 2017-04-19: 250 mg via INTRAMUSCULAR
  Filled 2017-04-19: qty 250

## 2017-04-19 NOTE — ED Triage Notes (Signed)
Pt girlfriend told him she slept with someone else. Pt has had suprapubic pain and yellow dc from penis x 3 days. Urinary freq also.

## 2017-04-19 NOTE — Discharge Instructions (Signed)
You have been treated for both gonorrhea and chlamydia.  All sexual partners will also need to be tested.  Return for any worsening symptoms

## 2017-04-20 LAB — GC/CHLAMYDIA PROBE AMP (~~LOC~~) NOT AT ARMC
Chlamydia: NEGATIVE
Neisseria Gonorrhea: NEGATIVE

## 2017-04-21 NOTE — ED Provider Notes (Signed)
[facility] EMERGENCY DEPARTMENT Provider Note   CSN: [id number] Arrival date & time: 04/19/17  1519     History   Chief Complaint Chief Complaint  Patient presents with  . Exposure to STD    HPI Elijah Miller is a 36 y.o. male.  HPI   Elijah Miller is a 36 y.o. male who presents to the Emergency Department requesting evaluation for possible STD.  States that his partner recently had intercourse with someone else and he noticed burning with urination, penile discharge and pelvic "discomfort" for 3 days.  Describes an intermittent yellow drainage from his penis.  States that his partner is not having any symptoms currently.  He denies fever, vomiting, genital rash, back pain and hematuria.    Past Medical History:  Diagnosis Date  . Diabetes mellitus without complication [facility])     Patient Active Problem List   Diagnosis Date Noted  . Diabetic hyperosmolar non-ketotic state ([place]) 12/27/2016  . Hyponatremia 12/27/2016  . Oral thrush 12/27/2016  . AKI (acute kidney injury) ([place]) 12/27/2016  . Tobacco abuse 12/27/2016    Past Surgical History:  Procedure Laterality Date  . KNEE ARTHROSCOPY         Home Medications    Prior to Admission medications   Medication Sig Start Date End Date Taking? Authorizing Provider  blood glucose meter kit and supplies Dispense based on patient and insurance preference. Use up to four times daily as directed. (FOR ICD-9 250.00, 250.01). 12/29/16   Arrien, Jimmy Picket, MD  insulin aspart protamine- aspart (NOVOLOG MIX 70/30) (70-30) 100 UNIT/ML injection Inject 0.15 mLs (15 Units total) into the skin 2 (two) times daily with a meal. 12/29/16   Arrien, Jimmy Picket, MD    Family History History reviewed. No pertinent family history.  Social History Social History   Tobacco Use  . Smoking status: Current Every Day Smoker    Packs/day: 1.00    Types: Cigarettes  . Smokeless tobacco: Never Used  Substance Use Topics  .  Alcohol use: No    Comment: occ  . Drug use: No     Allergies   Patient has no known allergies.   Review of Systems Review of Systems  Constitutional: Negative for chills and fever.  HENT: Negative for sore throat.   Respiratory: Negative for shortness of breath.   Cardiovascular: Negative for chest pain.  Gastrointestinal: Positive for abdominal pain (pelvic discomfort). Negative for nausea and vomiting.  Genitourinary: Positive for discharge and dysuria. Negative for difficulty urinating, flank pain, genital sores, hematuria, penile swelling, scrotal swelling and testicular pain.  Musculoskeletal: Negative for back pain and myalgias.  Skin: Negative for rash.     Physical Exam Updated Vital Signs BP (!) 148/98 (BP Location: Right Arm)   Pulse 98   Temp 98.3 F (36.8 C) (Oral)   Resp 16   SpO2 99%   Physical Exam  Constitutional: He is oriented to person, place, and time. He appears well-developed and well-nourished. No distress.  HENT:  Head: Atraumatic.  Mouth/Throat: Oropharynx is clear and moist.  Cardiovascular: Normal rate and regular rhythm.  Pulmonary/Chest: Effort normal and breath sounds normal. No respiratory distress.  Abdominal: Soft. Normal appearance. He exhibits no distension and no mass. There is no hepatosplenomegaly. There is no tenderness. There is no guarding. Hernia confirmed negative in the right inguinal area and confirmed negative in the left inguinal area.  Genitourinary: Testes normal and penis normal. Cremasteric reflex is present. Right testis shows no  swelling and no tenderness. Right testis is descended. Left testis shows no swelling and no tenderness. Left testis is descended. Circumcised. No penile tenderness. No discharge found.  Genitourinary Comments: Exam chaperoned by nursing.  No genital lesions, penile swab obtained, no discharge seen.  No inguinal tenderness or masses.    Musculoskeletal: Normal range of motion.  Lymphadenopathy: No  inguinal adenopathy noted on the right or left side.  Neurological: He is alert and oriented to person, place, and time.  Skin: Skin is warm. No rash noted.  Nursing note and vitals reviewed.    ED Treatments / Results  Labs (all labs ordered are listed, but only abnormal results are displayed) Labs Reviewed  URINALYSIS, ROUTINE W REFLEX MICROSCOPIC - Abnormal; Notable for the following components:      Result Value   Ketones, ur 20 (*)    All other components within normal limits  GC/CHLAMYDIA PROBE AMP ([place]) NOT AT [facility]    EKG  EKG Interpretation None       Radiology No results found.  Procedures Procedures (including critical care time)  Medications Ordered in ED Medications  cefTRIAXone (ROCEPHIN) injection 250 mg (250 mg Intramuscular Given 04/19/17 1744)  azithromycin (ZITHROMAX) tablet 1,000 mg (1,000 mg Oral Given 04/19/17 1745)     Initial Impression / Assessment and Plan / ED Course  I have reviewed the triage vital signs and the nursing notes.  Pertinent labs & imaging results that were available during my care of the patient were reviewed by me and considered in my medical decision making (see chart for details).     No abdominal tenderness on exam, no penile discharge seen.  Pt reports possible exposure, so I will tx with zithromax and rocephin.  Pt well appearing.  Informed that sexual partners will need to be tested.  Return precautions discussed.  Final Clinical Impressions(s) / ED Diagnoses   Final diagnoses:  Exposure to STD    ED Discharge Orders    None       Kem Parkinson, PA-C 04/21/17 1405    Mesner, Corene Cornea, MD 04/22/17 0730

## 2017-05-22 ENCOUNTER — Encounter (HOSPITAL_COMMUNITY): Payer: Self-pay | Admitting: *Deleted

## 2017-05-22 ENCOUNTER — Emergency Department (HOSPITAL_COMMUNITY)
Admission: EM | Admit: 2017-05-22 | Discharge: 2017-05-23 | Disposition: A | Discharge status: Home / Self Care | Payer: Self-pay | Attending: Emergency Medicine | Admitting: Emergency Medicine

## 2017-05-22 ENCOUNTER — Emergency Department (HOSPITAL_COMMUNITY): Payer: Self-pay

## 2017-05-22 DIAGNOSIS — M940 Chondrocostal junction syndrome [Tietze]: Secondary | ICD-10-CM | POA: Insufficient documentation

## 2017-05-22 DIAGNOSIS — Z794 Long term (current) use of insulin: Secondary | ICD-10-CM | POA: Insufficient documentation

## 2017-05-22 DIAGNOSIS — R0789 Other chest pain: Secondary | ICD-10-CM

## 2017-05-22 DIAGNOSIS — E119 Type 2 diabetes mellitus without complications: Secondary | ICD-10-CM | POA: Insufficient documentation

## 2017-05-22 DIAGNOSIS — F17210 Nicotine dependence, cigarettes, uncomplicated: Secondary | ICD-10-CM | POA: Insufficient documentation

## 2017-05-22 LAB — CBC WITH DIFFERENTIAL/PLATELET
BASOS ABS: 0 10*3/uL (ref 0.0–0.1)
BASOS PCT: 0 %
EOS ABS: 0 10*3/uL (ref 0.0–0.7)
EOS PCT: 0 %
HCT: 47 % (ref 39.0–52.0)
Hemoglobin: 16.1 g/dL (ref 13.0–17.0)
Lymphocytes Relative: 23 %
Lymphs Abs: 1.9 10*3/uL (ref 0.7–4.0)
MCH: 30.8 pg (ref 26.0–34.0)
MCHC: 34.3 g/dL (ref 30.0–36.0)
MCV: 90 fL (ref 78.0–100.0)
MONO ABS: 0.5 10*3/uL (ref 0.1–1.0)
Monocytes Relative: 6 %
NEUTROS ABS: 6.1 10*3/uL (ref 1.7–7.7)
Neutrophils Relative %: 71 %
PLATELETS: 216 10*3/uL (ref 150–400)
RBC: 5.22 MIL/uL (ref 4.22–5.81)
RDW: 12.6 % (ref 11.5–15.5)
WBC: 8.5 10*3/uL (ref 4.0–10.5)

## 2017-05-22 LAB — CBG MONITORING, ED
GLUCOSE-CAPILLARY: 144 mg/dL — AB (ref 65–99)
Glucose-Capillary: 150 mg/dL — ABNORMAL HIGH (ref 65–99)

## 2017-05-22 LAB — BASIC METABOLIC PANEL
ANION GAP: 9 (ref 5–15)
BUN: 12 mg/dL (ref 6–20)
CALCIUM: 9.4 mg/dL (ref 8.9–10.3)
CO2: 23 mmol/L (ref 22–32)
CREATININE: 0.87 mg/dL (ref 0.61–1.24)
Chloride: 105 mmol/L (ref 101–111)
Glucose, Bld: 151 mg/dL — ABNORMAL HIGH (ref 65–99)
Potassium: 3.6 mmol/L (ref 3.5–5.1)
SODIUM: 137 mmol/L (ref 135–145)

## 2017-05-22 MED ORDER — METHOCARBAMOL 500 MG PO TABS
500.0000 mg | ORAL_TABLET | Freq: Once | ORAL | Status: AC
  Administered 2017-05-23: 500 mg via ORAL
  Filled 2017-05-22: qty 1

## 2017-05-22 MED ORDER — IBUPROFEN 400 MG PO TABS
600.0000 mg | ORAL_TABLET | Freq: Once | ORAL | Status: AC
  Administered 2017-05-23: 600 mg via ORAL
  Filled 2017-05-22: qty 2

## 2017-05-22 NOTE — ED Triage Notes (Signed)
Pt recently dx with diabetes, feels shaky for a couple hours and has not taken blood sugar, c/o mid cp for couple of hours with sob.  No distress noted.  Denies N/V/D.  Last ate this morning.

## 2017-05-22 NOTE — ED Notes (Signed)
ekg handed to Dr. Manus Gunningancour

## 2017-05-22 NOTE — ED Provider Notes (Signed)
[facility] EMERGENCY DEPARTMENT Provider Note   CSN: [id number] Arrival date & time: 05/22/17  2232  Time seen 23:13 PM  History   Chief Complaint Chief Complaint  Patient presents with  . Diabetes    feels shaky    HPI Elijah Miller is a 36 y.o. male.  HPI patient states he was diagnosed with diabetes in July.  He states since then he only had one follow-up a week after he was discharged at the health department.  He states he is not getting any guidance from a physician to manage his diabetes.  He states typically in the morning his blood sugar will be around 98, and afternoon it be like 190 and when he checks it at 6 PM before dinner can be in the 200s.  He states he takes 5 units of NovoLog 70/30 with his meals and then adds 1 unit for every 50 g of carbs so he ends up taking 6-7 units twice a day.  He states about 5:30 PM he felt shaky.  He did not check his CBG.  He also states he has had chest heaviness all day long and states he woke up with it.  He states it is in the middle of his chest.  It has been there constantly.  He states smoking makes it hurt more.  He states taking big deep breaths to calm himself make it feel better although sometimes deep breaths makes it hurt more.  He has had mild nausea without vomiting, he thinks he may be had some clamminess.  Sometimes it makes him feel short of breath.  The pain does not radiate and he does not have a cough.  He states he has had this pain before while arguing.  He denies any possible injury or change of activity.  He states his father had a pacemaker and had open heart surgery, his father died at age 38.  PCP [facility] Department   Past Medical History:  Diagnosis Date  . Diabetes mellitus without complication [facility])     Patient Active Problem List   Diagnosis Date Noted  . Diabetic hyperosmolar non-ketotic state ([place]) 12/27/2016  . Hyponatremia 12/27/2016  . Oral thrush 12/27/2016  . AKI (acute kidney  injury) ([place]) 12/27/2016  . Tobacco abuse 12/27/2016    Past Surgical History:  Procedure Laterality Date  . KNEE ARTHROSCOPY         Home Medications    Prior to Admission medications   Medication Sig Start Date End Date Taking? Authorizing Provider  blood glucose meter kit and supplies Dispense based on patient and insurance preference. Use up to four times daily as directed. (FOR ICD-9 250.00, 250.01). 12/29/16   Arrien, Jimmy Picket, MD  insulin aspart protamine- aspart (NOVOLOG MIX 70/30) (70-30) 100 UNIT/ML injection Inject 0.07 mLs (7 Units total) into the skin 2 (two) times daily with a meal. 05/23/17   Rolland Porter, MD    Family History History reviewed. No pertinent family history.  Social History Social History   Tobacco Use  . Smoking status: Current Every Day Smoker    Packs/day: 1.00    Types: Cigarettes  . Smokeless tobacco: Never Used  Substance Use Topics  . Alcohol use: No    Comment: occ  . Drug use: No   employed  Allergies   Patient has no known allergies.   Review of Systems Review of Systems  All other systems reviewed and are negative.    Physical Exam  Updated Vital Signs BP (!) 136/94 (BP Location: Left Arm)   Pulse 86   Temp 98.1 F (36.7 C) (Oral)   Resp 20   Ht 5' 11"  (1.803 m)   Wt 77.1 kg (170 lb)   SpO2 100%   BMI 23.71 kg/m   Vital signs normal    Physical Exam  Constitutional: He is oriented to person, place, and time. He appears well-developed and well-nourished.  Non-toxic appearance. He does not appear ill. No distress.  HENT:  Head: Normocephalic and atraumatic.  Right Ear: External ear normal.  Left Ear: External ear normal.  Nose: Nose normal. No mucosal edema or rhinorrhea.  Mouth/Throat: Oropharynx is clear and moist and mucous membranes are normal. No dental abscesses or uvula swelling.  Eyes: Conjunctivae and EOM are normal. Pupils are equal, round, and reactive to light.  Neck: Normal range of motion  and full passive range of motion without pain. Neck supple.  Cardiovascular: Normal rate, regular rhythm and normal heart sounds. Exam reveals no gallop and no friction rub.  No murmur heard. Pulmonary/Chest: Effort normal and breath sounds normal. No respiratory distress. He has no wheezes. He has no rhonchi. He has no rales. He exhibits tenderness. He exhibits no crepitus.  He has some tenderness over the right costochondral junctions in the middle section however he states that feels different from his chest heaviness.    Abdominal: Soft. Normal appearance and bowel sounds are normal. He exhibits no distension. There is no tenderness. There is no rebound and no guarding.  Musculoskeletal: Normal range of motion. He exhibits no edema or tenderness.  Moves all extremities well.   Neurological: He is alert and oriented to person, place, and time. He has normal strength. No cranial nerve deficit.  Skin: Skin is warm, dry and intact. No rash noted. No erythema. No pallor.  Multiple tatooes  Psychiatric: He has a normal mood and affect. His speech is normal and behavior is normal. His mood appears not anxious.  Nursing note and vitals reviewed.    ED Treatments / Results  Labs (all labs ordered are listed, but only abnormal results are displayed) Results for orders placed or performed during the hospital encounter of 05/22/17  CBC with Differential  Result Value Ref Range   WBC 8.5 4.0 - 10.5 K/uL   RBC 5.22 4.22 - 5.81 MIL/uL   Hemoglobin 16.1 13.0 - 17.0 g/dL   HCT 47.0 39.0 - 52.0 %   MCV 90.0 78.0 - 100.0 fL   MCH 30.8 26.0 - 34.0 pg   MCHC 34.3 30.0 - 36.0 g/dL   RDW 12.6 11.5 - 15.5 %   Platelets 216 150 - 400 K/uL   Neutrophils Relative % 71 %   Neutro Abs 6.1 1.7 - 7.7 K/uL   Lymphocytes Relative 23 %   Lymphs Abs 1.9 0.7 - 4.0 K/uL   Monocytes Relative 6 %   Monocytes Absolute 0.5 0.1 - 1.0 K/uL   Eosinophils Relative 0 %   Eosinophils Absolute 0.0 0.0 - 0.7 K/uL    Basophils Relative 0 %   Basophils Absolute 0.0 0.0 - 0.1 K/uL  Basic metabolic panel  Result Value Ref Range   Sodium 137 135 - 145 mmol/L   Potassium 3.6 3.5 - 5.1 mmol/L   Chloride 105 101 - 111 mmol/L   CO2 23 22 - 32 mmol/L   Glucose, Bld 151 (H) 65 - 99 mg/dL   BUN 12 6 - 20 mg/dL   Creatinine,  Ser 0.87 0.61 - 1.24 mg/dL   Calcium 9.4 8.9 - 10.3 mg/dL   GFR calc non Af Amer >60 >60 mL/min   GFR calc Af Amer >60 >60 mL/min   Anion gap 9 5 - 15  D-dimer, quantitative (not at [facility])  Result Value Ref Range   D-Dimer, Quant <0.27 0.00 - 0.50 ug/mL-FEU  Troponin I  Result Value Ref Range   Troponin I <0.03 <0.03 ng/mL  CBG monitoring, ED  Result Value Ref Range   Glucose-Capillary 150 (H) 65 - 99 mg/dL   Comment 1 Notify RN    Comment 2 Document in Chart   CBG monitoring, ED  Result Value Ref Range   Glucose-Capillary 144 (H) 65 - 99 mg/dL  CBG monitoring, ED  Result Value Ref Range   Glucose-Capillary 131 (H) 65 - 99 mg/dL   Comment 1 Notify RN    Comment 2 Document in Chart    Laboratory interpretation all normal except improving hyperglycemia    EKG  EKG Interpretation  Date/Time:  Monday May 22 2017 22:46:03 EST Ventricular Rate:  82 PR Interval:    QRS Duration: 87 QT Interval:  373 QTC Calculation: 436 R Axis:   69 Text Interpretation:  Sinus rhythm Probable anteroseptal infarct, old No old tracing to compare Confirmed by Rolland Porter [phone]) on 05/22/2017 10:54:37 PM       Radiology Dg Chest 2 View  Result Date: 05/22/2017 CLINICAL DATA:  Acute onset of mid chest pain and shortness of breath. Shakiness. EXAM: CHEST  2 VIEW COMPARISON:  Chest radiograph performed 12/27/2016 FINDINGS: The lungs are well-aerated and clear. There is no evidence of focal opacification, pleural effusion or pneumothorax. The heart is normal in size; the mediastinal contour is within normal limits. No acute osseous abnormalities are seen. IMPRESSION: No acute  cardiopulmonary process seen. Electronically Signed   By: Garald Balding M.D.   On: 05/22/2017 23:14    Procedures Procedures (including critical care time)  Medications Ordered in ED Medications  ibuprofen (ADVIL,MOTRIN) tablet 600 mg (600 mg Oral Given 05/23/17 0025)  methocarbamol (ROBAXIN) tablet 500 mg (500 mg Oral Given 05/23/17 0026)     Initial Impression / Assessment and Plan / ED Course  I have reviewed the triage vital signs and the nursing notes.  Pertinent labs & imaging results that were available during my care of the patient were reviewed by me and considered in my medical decision making (see chart for details).    Patient was given Advil and Robaxin.  Laboratory testing was done including evaluation for myocardial ischemia which is doubtful, PE screen, pneumonia.  Recheck at 1:30 AM patient states he is feeling better.  He now tells me that he is not allowed to go home tonight because he had an argument with his significant other.  This was not relayed to me earlier.  So we discussed his pain is most likely chest wall pain or costochondritis.  He states his medication is at the house, he states he was given 3 months of samples to take.  I will write him another prescription for the metformin however I advised him he needs to get a primary care doctor to manage his diabetes better.  We also discussed stopping smoking due to increased risk of amputation with smoking and diabetes especially diabetes that is not well managed.  Final Clinical Impressions(s) / ED Diagnoses   Final diagnoses:  Acute chest wall pain  Costochondritis    ED Discharge Orders  Ordered    insulin aspart protamine- aspart (NOVOLOG MIX 70/30) (70-30) 100 UNIT/ML injection  2 times daily with meals     05/23/17 0136    OTC ibuprofen    Plan discharge   Rolland Porter, MD, Barbette Or, MD 05/23/17 [phone]

## 2017-05-23 LAB — TROPONIN I

## 2017-05-23 LAB — D-DIMER, QUANTITATIVE: D-Dimer, Quant: <0.27 ug/mL-FEU (ref 0.00–0.50)

## 2017-05-23 LAB — CBG MONITORING, ED: GLUCOSE-CAPILLARY: 131 mg/dL — AB (ref 65–99)

## 2017-05-23 MED ORDER — INSULIN ASPART PROT & ASPART (70-30 MIX) 100 UNIT/ML ~~LOC~~ SUSP: 7.0000 [IU] | Freq: Two times a day (BID) | SUBCUTANEOUS | 0 refills | Status: DC

## 2017-05-23 NOTE — Discharge Instructions (Signed)
Use ice and heat for comfort. Take ibuprofen 600 mg 4 times a day as needed for chest wall pain. Take your insulin and monitor your blood sugar daily. You need to stop smoking and get a primary care doctor to manage your diabetes. Recheck if you get a fever, you struggle to breathe or have uncontrolled vomiting.

## 2018-06-07 ENCOUNTER — Encounter (HOSPITAL_COMMUNITY): Payer: Self-pay | Admitting: Emergency Medicine

## 2018-06-07 ENCOUNTER — Emergency Department (HOSPITAL_COMMUNITY)
Admission: EM | Admit: 2018-06-07 | Discharge: 2018-06-07 | Disposition: A | Discharge status: Home / Self Care | Payer: Self-pay | Attending: Emergency Medicine | Admitting: Emergency Medicine

## 2018-06-07 ENCOUNTER — Other Ambulatory Visit: Payer: Self-pay

## 2018-06-07 DIAGNOSIS — R739 Hyperglycemia, unspecified: Secondary | ICD-10-CM

## 2018-06-07 DIAGNOSIS — F17210 Nicotine dependence, cigarettes, uncomplicated: Secondary | ICD-10-CM | POA: Insufficient documentation

## 2018-06-07 DIAGNOSIS — Z760 Encounter for issue of repeat prescription: Secondary | ICD-10-CM | POA: Insufficient documentation

## 2018-06-07 DIAGNOSIS — E1100 Type 2 diabetes mellitus with hyperosmolarity without nonketotic hyperglycemic-hyperosmolar coma (NKHHC): Secondary | ICD-10-CM | POA: Insufficient documentation

## 2018-06-07 DIAGNOSIS — E1165 Type 2 diabetes mellitus with hyperglycemia: Secondary | ICD-10-CM | POA: Insufficient documentation

## 2018-06-07 LAB — BASIC METABOLIC PANEL
Anion gap: 5 (ref 5–15)
BUN: 19 mg/dL (ref 6–20)
CHLORIDE: 108 mmol/L (ref 98–111)
CO2: 25 mmol/L (ref 22–32)
CREATININE: 1.05 mg/dL (ref 0.61–1.24)
Calcium: 9 mg/dL (ref 8.9–10.3)
GFR calc Af Amer: >60 mL/min (ref >60)
GFR calc non Af Amer: >60 mL/min (ref >60)
GLUCOSE: 271 mg/dL — AB (ref 70–99)
POTASSIUM: 4.1 mmol/L (ref 3.5–5.1)
SODIUM: 138 mmol/L (ref 135–145)

## 2018-06-07 LAB — CBG MONITORING, ED: Glucose-Capillary: 241 mg/dL — ABNORMAL HIGH (ref 70–99)

## 2018-06-07 MED ORDER — INSULIN ASPART PROT & ASPART (70-30 MIX) 100 UNIT/ML ~~LOC~~ SUSP: SUBCUTANEOUS | 1 refills | Status: AC

## 2018-06-07 MED ORDER — INSULIN ASPART 100 UNIT/ML ~~LOC~~ SOLN
6.0000 [IU] | Freq: Once | SUBCUTANEOUS | Status: AC
  Administered 2018-06-07: 6 [IU] via SUBCUTANEOUS
  Filled 2018-06-07: qty 1

## 2018-06-07 NOTE — Discharge Instructions (Signed)
Be sure to eat a meal when you get home.  Monitor your blood sugar daily and take your insulin as directed.  You may call and of the clinics listed to establish primary care.  You may call the main number on your discharge papers to speak to the caseworker to see if you qualify for any assistance with getting your medications.

## 2018-06-07 NOTE — ED Notes (Signed)
Phlebotomy at bedside.

## 2018-06-07 NOTE — Care Management (Signed)
CM contacted by pt after DC. He is unable to afford insulin. CM completed MATCH and [facility].

## 2018-06-07 NOTE — ED Triage Notes (Signed)
Patient states he just got out of jail and has been without his insulin x 2 days. Denies vomiting or pain at triage. States "I don't have a job or insurance and I have an appt Jan 29 with the health dept."

## 2018-06-07 NOTE — ED Notes (Signed)
EDP at bedside  

## 2018-06-08 NOTE — ED Provider Notes (Signed)
[facility] EMERGENCY DEPARTMENT Provider Note   CSN: [id number] Arrival date & time: 06/07/18  1010     History   Chief Complaint Chief Complaint  Patient presents with  . Medication Refill    HPI Elijah Miller is a 37 y.o. male.  HPI  Elijah Miller is a 38 y.o. male who presents to the Emergency Department requesting refill of his insulin.  He states that he was recently incarcerated and released 2 days ago and he has been without his medication for 2 days.  He denies any symptoms and states that he has a appointment with the health department on January 29.  He does not currently have insurance or medication to afford his insulin.  He states that he was taking NovoLog on a sliding scale.  No recent increased thirst or urination, fever, vomiting, abdominal pain or dizziness.   Past Medical History:  Diagnosis Date  . Diabetes mellitus without complication [facility])     Patient Active Problem List   Diagnosis Date Noted  . Diabetic hyperosmolar non-ketotic state ([place]) 12/27/2016  . Hyponatremia 12/27/2016  . Oral thrush 12/27/2016  . AKI (acute kidney injury) ([place]) 12/27/2016  . Tobacco abuse 12/27/2016    Past Surgical History:  Procedure Laterality Date  . KNEE ARTHROSCOPY        Home Medications    Prior to Admission medications   Medication Sig Start Date End Date Taking? Authorizing Provider  blood glucose meter kit and supplies Dispense based on patient and insurance preference. Use up to four times daily as directed. (FOR ICD-9 250.00, 250.01). 12/29/16   Arrien, Jimmy Picket, MD  insulin aspart protamine- aspart (NOVOLOG MIX 70/30) (70-30) 100 UNIT/ML injection Inject 0.07 a males (7 units total) into the skin 2 times a day with a meal 06/07/18   Elysse Polidore, PA-C    Family History History reviewed. No pertinent family history.  Social History Social History   Tobacco Use  . Smoking status: Current Every Day Smoker    Packs/day: 1.00   Types: Cigarettes  . Smokeless tobacco: Never Used  Substance Use Topics  . Alcohol use: No    Comment: occ  . Drug use: No     Allergies   Patient has no known allergies.   Review of Systems Review of Systems  Constitutional: Negative for chills, fatigue and fever.  HENT: Negative for sore throat.   Respiratory: Negative for cough, shortness of breath and wheezing.   Cardiovascular: Negative for chest pain and palpitations.  Gastrointestinal: Negative for abdominal pain, blood in stool, nausea and vomiting.  Endocrine: Negative for polydipsia and polyuria.  Genitourinary: Negative for difficulty urinating, dysuria and frequency.  Musculoskeletal: Negative for arthralgias, back pain, myalgias, neck pain and neck stiffness.  Skin: Negative for rash.  Neurological: Negative for dizziness, syncope, weakness, numbness and headaches.  Hematological: Does not bruise/bleed easily.  Psychiatric/Behavioral: Negative for confusion.     Physical Exam Updated Vital Signs BP (!) 137/98 (BP Location: Right Arm)   Pulse 74   Temp 97.9 F (36.6 C) (Oral)   Resp 16   Ht 5' 10"  (1.778 m)   Wt 72.6 kg   SpO2 100%   BMI 22.96 kg/m   Physical Exam Vitals signs and nursing note reviewed.  Constitutional:      General: He is not in acute distress.    Appearance: Normal appearance. He is not ill-appearing or toxic-appearing.  HENT:     Head: Atraumatic.  Mouth/Throat:     Mouth: Mucous membranes are moist.     Pharynx: Oropharynx is clear. No posterior oropharyngeal erythema.  Neck:     Musculoskeletal: Normal range of motion and neck supple.  Cardiovascular:     Rate and Rhythm: Normal rate and regular rhythm.     Pulses: Normal pulses.     Heart sounds: Normal heart sounds.  Pulmonary:     Effort: Pulmonary effort is normal.     Breath sounds: Normal breath sounds.  Abdominal:     General: There is no distension.     Palpations: Abdomen is soft.     Tenderness: There is  no abdominal tenderness.  Musculoskeletal: Normal range of motion.  Skin:    General: Skin is warm.     Capillary Refill: Capillary refill takes less than 2 seconds.     Findings: No rash.  Neurological:     General: No focal deficit present.     Mental Status: He is alert. Mental status is at baseline.     Sensory: No sensory deficit.     Motor: No weakness.     Gait: Gait normal.  Psychiatric:        Mood and Affect: Mood normal.      ED Treatments / Results  Labs (all labs ordered are listed, but only abnormal results are displayed) Labs Reviewed  BASIC METABOLIC PANEL - Abnormal; Notable for the following components:      Result Value   Glucose, Bld 271 (*)    All other components within normal limits  CBG MONITORING, ED - Abnormal; Notable for the following components:   Glucose-Capillary 241 (*)    All other components within normal limits    EKG None  Radiology No results found.  Procedures Procedures (including critical care time)  Medications Ordered in ED Medications  insulin aspart (novoLOG) injection 6 Units (6 Units Subcutaneous Given 06/07/18 1239)     Initial Impression / Assessment and Plan / ED Course  I have reviewed the triage vital signs and the nursing notes.  Pertinent labs & imaging results that were available during my care of the patient were reviewed by me and considered in my medical decision making (see chart for details).    Patient here requesting refill of his insulin.  He has an appointment with the health department in January.  He is well-appearing.  Vitals are reassuring.  No concerning symptoms for DKA.  Anion gap is within normal limits.  I will refill his NovoLog and he agrees to close outpatient follow-up.  Return precautions were discussed.  He appears appropriate for discharge home.  Final Clinical Impressions(s) / ED Diagnoses   Final diagnoses:  Medication refill  Hyperglycemia    ED Discharge Orders          Ordered    insulin aspart protamine- aspart (NOVOLOG MIX 70/30) (70-30) 100 UNIT/ML injection     06/07/18 1234           Kem Parkinson, PA-C 06/08/18 0849    Daleen Bo, MD 06/08/18 [phone]

## 2019-01-08 ENCOUNTER — Encounter (HOSPITAL_COMMUNITY): Payer: Self-pay | Admitting: Emergency Medicine

## 2019-01-08 ENCOUNTER — Other Ambulatory Visit: Payer: Self-pay

## 2019-01-08 ENCOUNTER — Emergency Department (HOSPITAL_COMMUNITY): Payer: Self-pay

## 2019-01-08 ENCOUNTER — Emergency Department (HOSPITAL_COMMUNITY)
Admission: EM | Admit: 2019-01-08 | Discharge: 2019-01-08 | Disposition: A | Discharge status: Home / Self Care | Payer: Self-pay | Attending: Emergency Medicine | Admitting: Emergency Medicine

## 2019-01-08 DIAGNOSIS — F17210 Nicotine dependence, cigarettes, uncomplicated: Secondary | ICD-10-CM | POA: Insufficient documentation

## 2019-01-08 DIAGNOSIS — Z794 Long term (current) use of insulin: Secondary | ICD-10-CM | POA: Insufficient documentation

## 2019-01-08 DIAGNOSIS — Y939 Activity, unspecified: Secondary | ICD-10-CM | POA: Insufficient documentation

## 2019-01-08 DIAGNOSIS — E119 Type 2 diabetes mellitus without complications: Secondary | ICD-10-CM | POA: Insufficient documentation

## 2019-01-08 DIAGNOSIS — Y990 Civilian activity done for income or pay: Secondary | ICD-10-CM | POA: Insufficient documentation

## 2019-01-08 DIAGNOSIS — Y929 Unspecified place or not applicable: Secondary | ICD-10-CM | POA: Insufficient documentation

## 2019-01-08 DIAGNOSIS — S39012A Strain of muscle, fascia and tendon of lower back, initial encounter: Secondary | ICD-10-CM | POA: Insufficient documentation

## 2019-01-08 DIAGNOSIS — X500XXA Overexertion from strenuous movement or load, initial encounter: Secondary | ICD-10-CM | POA: Insufficient documentation

## 2019-01-08 MED ORDER — ONDANSETRON HCL 4 MG PO TABS
4.0000 mg | ORAL_TABLET | Freq: Once | ORAL | Status: AC
  Administered 2019-01-08: 4 mg via ORAL
  Filled 2019-01-08: qty 1

## 2019-01-08 MED ORDER — DEXAMETHASONE 4 MG PO TABS: 4.0000 mg | ORAL_TABLET | Freq: Two times a day (BID) | ORAL | 0 refills | Status: AC

## 2019-01-08 MED ORDER — PREDNISONE 20 MG PO TABS
40.0000 mg | ORAL_TABLET | Freq: Once | ORAL | Status: AC
  Administered 2019-01-08: 40 mg via ORAL
  Filled 2019-01-08: qty 2

## 2019-01-08 MED ORDER — INDOMETHACIN 25 MG PO CAPS
25.0000 mg | ORAL_CAPSULE | Freq: Once | ORAL | Status: AC
  Administered 2019-01-08: 25 mg via ORAL
  Filled 2019-01-08: qty 1

## 2019-01-08 MED ORDER — CYCLOBENZAPRINE HCL 10 MG PO TABS: 10.0000 mg | ORAL_TABLET | Freq: Three times a day (TID) | ORAL | 0 refills | Status: AC

## 2019-01-08 MED ORDER — CYCLOBENZAPRINE HCL 10 MG PO TABS
10.0000 mg | ORAL_TABLET | Freq: Once | ORAL | Status: AC
  Administered 2019-01-08: 10 mg via ORAL
  Filled 2019-01-08: qty 1

## 2019-01-08 MED ORDER — IBUPROFEN 600 MG PO TABS: 600.0000 mg | ORAL_TABLET | Freq: Four times a day (QID) | ORAL | 0 refills | Status: AC

## 2019-01-08 NOTE — ED Triage Notes (Signed)
Pt c/o lower back pain that radiates down the left leg x 2 weeks.

## 2019-01-08 NOTE — Discharge Instructions (Signed)
Your blood pressure is slightly elevated.  The remainder the vital signs are within normal limits.  Your oxygen level is 97% on room air.  Within normal limits by my interpretation. No acute neurologic deficits identified on tonight's examination.  The x-ray is negative for fracture or dislocation or other problems.  Please use a heating pad to your back.  Please rest your back as much as possible.  Use Flexeril 3 times daily for spasm pain.  Use ibuprofen with breakfast, lunch, dinner, and at bedtime.  Use Decadron 2 times daily with food.  Flexeril may cause drowsiness, and/or lightheadedness.  Please do not drive a vehicle, drink alcohol, operate machinery, or participate in activities requiring concentration when taking this medication.  Please see Dr. Stann Mainland for orthopedic evaluation if this issue is not improving.

## 2019-01-08 NOTE — ED Provider Notes (Signed)
[facility] EMERGENCY DEPARTMENT Provider Note   CSN: [id number] Arrival date & time: 01/08/19  1933     History   Chief Complaint Chief Complaint  Patient presents with  . Back Pain    HPI Elijah Miller is a 38 y.o. male.     Patient is a 38 year old male who presents to the emergency department with a complaint of lower back pain.  The patient states that this problem started about 2 weeks ago.  He works with [employer].  He recalls having done some heavy lifting just prior to this starting, and feeling a sensation as though his back snapped.  He says he has been having some discomfort since that time.  He has not had any loss of bowel or bladder function.  He is not had any loss of control or use of his lower extremities.  There is been no numbness in the saddle areas.  No previous operations or procedures involving the back.  He has tried conservative measures at home, but says it seems as though this problem is getting worse, particularly if he has heavy lifting day at work.  The history is provided by the patient.    Past Medical History:  Diagnosis Date  . Diabetes mellitus without complication [facility])     Patient Active Problem List   Diagnosis Date Noted  . Diabetic hyperosmolar non-ketotic state ([place]) 12/27/2016  . Hyponatremia 12/27/2016  . Oral thrush 12/27/2016  . AKI (acute kidney injury) ([place]) 12/27/2016  . Tobacco abuse 12/27/2016    Past Surgical History:  Procedure Laterality Date  . KNEE ARTHROSCOPY          Home Medications    Prior to Admission medications   Medication Sig Start Date End Date Taking? Authorizing Provider  blood glucose meter kit and supplies Dispense based on patient and insurance preference. Use up to four times daily as directed. (FOR ICD-9 250.00, 250.01). 12/29/16   Arrien, Elijah Picket, MD  insulin aspart protamine- aspart (NOVOLOG MIX 70/30) (70-30) 100 UNIT/ML injection Inject 0.07 a males (7 units total)  into the skin 2 times a day with a meal 06/07/18   Triplett, Tammy, PA-C    Family History No family history on file.  Social History Social History   Tobacco Use  . Smoking status: Current Every Day Smoker    Packs/day: 1.00    Types: Cigarettes  . Smokeless tobacco: Never Used  Substance Use Topics  . Alcohol use: Yes    Comment: occ  . Drug use: No     Allergies   Patient has no known allergies.   Review of Systems Review of Systems  Constitutional: Negative for activity change.       All ROS Neg except as noted in HPI  HENT: Negative for nosebleeds.   Eyes: Negative for photophobia and discharge.  Respiratory: Negative for cough, shortness of breath and wheezing.   Cardiovascular: Negative for chest pain and palpitations.  Gastrointestinal: Negative for abdominal pain and blood in stool.  Genitourinary: Negative for dysuria, frequency and hematuria.  Musculoskeletal: Positive for back pain. Negative for arthralgias and neck pain.  Skin: Negative.   Neurological: Negative for dizziness, seizures and speech difficulty.  Psychiatric/Behavioral: Negative for confusion and hallucinations.     Physical Exam Updated Vital Signs BP (!) 140/98 (BP Location: Right Arm)   Pulse 80   Temp 97.8 F (36.6 C) (Oral)   Resp 17   Ht _0  (1.803 m)  Wt 78.5 kg   SpO2 97%   BMI 24.13 kg/m   Physical Exam Vitals signs and nursing note reviewed.  Constitutional:      General: He is not in acute distress.    Appearance: He is well-developed.  HENT:     Head: Normocephalic and atraumatic.     Right Ear: External ear normal.     Left Ear: External ear normal.  Eyes:     General: No scleral icterus.       Right eye: No discharge.        Left eye: No discharge.     Conjunctiva/sclera: Conjunctivae normal.  Neck:     Musculoskeletal: Neck supple.     Trachea: No tracheal deviation.  Cardiovascular:     Rate and Rhythm: Normal rate and regular rhythm.  Pulmonary:      Effort: Pulmonary effort is normal. No respiratory distress.     Breath sounds: Normal breath sounds. No stridor. No wheezing or rales.  Abdominal:     General: Bowel sounds are normal. There is no distension.     Palpations: Abdomen is soft.     Tenderness: There is no abdominal tenderness. There is no guarding or rebound.  Musculoskeletal:        General: No tenderness.     Lumbar back: He exhibits pain and spasm.       Back:  Skin:    General: Skin is warm and dry.     Findings: No rash.  Neurological:     Mental Status: He is alert.     Cranial Nerves: No cranial nerve deficit (no facial droop, extraocular movements intact, no slurred speech).     Sensory: No sensory deficit.     Motor: No weakness, abnormal muscle tone or seizure activity.     Coordination: Coordination normal.     Comments: No numbness in the saddle area or signs of cauda equina.  Gait is steady.  No foot drop appreciated.      ED Treatments / Results  Labs (all labs ordered are listed, but only abnormal results are displayed) Labs Reviewed - No data to display  EKG None  Radiology Dg Lumbar Spine Complete  Result Date: 01/08/2019 CLINICAL DATA:  Lumbago after heavy lifting. Left lower extremity radicular symptoms EXAM: LUMBAR SPINE - COMPLETE 4+ VIEW COMPARISON:  None. FINDINGS: Frontal, lateral, spot lumbosacral lateral, and bilateral oblique views were obtained. There are 5 non-rib-bearing lumbar type vertebral bodies. There is no fracture or spondylolisthesis. The disc spaces appear unremarkable. There is no appreciable facet arthropathy. IMPRESSION: No fracture or spondylolisthesis.  No evident arthropathy. Electronically Signed   By: Lowella Grip III M.D.   On: 01/08/2019 21:13    Procedures Procedures (including critical care time)  Medications Ordered in ED Medications  cyclobenzaprine (FLEXERIL) tablet 10 mg (has no administration in time range)  predniSONE (DELTASONE) tablet 40 mg  (has no administration in time range)  indomethacin (INDOCIN) capsule 25 mg (has no administration in time range)  ondansetron (ZOFRAN) tablet 4 mg (has no administration in time range)     Initial Impression / Assessment and Plan / ED Course  I have reviewed the triage vital signs and the nursing notes.  Pertinent labs & imaging results that were available during my care of the patient were reviewed by me and considered in my medical decision making (see chart for details).       1  Final Clinical Impressions(s) / ED Diagnoses MDM  Blood pressure slightly elevated.  Vital signs are otherwise within normal limits.  No gross neurologic deficits appreciated.  No evidence for cauda equina or other emergent changes on examination.  X-ray of the lumbar spine is negative for any fracture or dislocation.  The disc spaces are well preserved.  I discussed the findings on the examination as well as the findings on the x-ray with the patient in terms of which he understands.  The patient is asked to use a heating pad to the area.  A prescription for anti-inflammatory pain medication and muscle relaxer given to the patient.  The patient is asked to rest his back over the next couple of days, and a work note has been given for the same.   Final diagnoses:  None    ED Discharge Orders         Ordered    cyclobenzaprine (FLEXERIL) 10 MG tablet  3 times daily     01/08/19 2126    ibuprofen (ADVIL) 600 MG tablet  4 times daily     01/08/19 2126    dexamethasone (DECADRON) 4 MG tablet  2 times daily with meals     01/08/19 2139           Lily Kocher, PA-C 01/09/19 1112    Virgel Manifold, MD 01/09/19 1816

## 2019-05-17 ENCOUNTER — Other Ambulatory Visit: Payer: Self-pay

## 2019-05-17 DIAGNOSIS — Z20822 Contact with and (suspected) exposure to covid-19: Secondary | ICD-10-CM

## 2019-05-19 LAB — NOVEL CORONAVIRUS, NAA: SARS-CoV-2, NAA: NOT DETECTED

## 2019-05-20 ENCOUNTER — Telehealth: Payer: Self-pay | Admitting: General Practice

## 2019-05-20 NOTE — Telephone Encounter (Signed)
Negative COVID results given. Patient results "NOT Detected." Caller expressed understanding. ° °

## 2020-06-08 ENCOUNTER — Other Ambulatory Visit: Payer: Self-pay

## 2020-06-08 ENCOUNTER — Emergency Department (HOSPITAL_COMMUNITY)
Admission: EM | Admit: 2020-06-08 | Discharge: 2020-06-09 | Disposition: A | Discharge status: Home / Self Care | Payer: Medicaid Other | Attending: Emergency Medicine | Admitting: Emergency Medicine

## 2020-06-08 ENCOUNTER — Encounter (HOSPITAL_COMMUNITY): Payer: Self-pay | Admitting: Emergency Medicine

## 2020-06-08 DIAGNOSIS — E1165 Type 2 diabetes mellitus with hyperglycemia: Secondary | ICD-10-CM | POA: Insufficient documentation

## 2020-06-08 DIAGNOSIS — F17210 Nicotine dependence, cigarettes, uncomplicated: Secondary | ICD-10-CM | POA: Insufficient documentation

## 2020-06-08 DIAGNOSIS — F1520 Other stimulant dependence, uncomplicated: Secondary | ICD-10-CM | POA: Insufficient documentation

## 2020-06-08 DIAGNOSIS — Z046 Encounter for general psychiatric examination, requested by authority: Secondary | ICD-10-CM

## 2020-06-08 DIAGNOSIS — R258 Other abnormal involuntary movements: Secondary | ICD-10-CM | POA: Insufficient documentation

## 2020-06-08 DIAGNOSIS — R739 Hyperglycemia, unspecified: Secondary | ICD-10-CM

## 2020-06-08 DIAGNOSIS — F1510 Other stimulant abuse, uncomplicated: Secondary | ICD-10-CM

## 2020-06-08 DIAGNOSIS — Z794 Long term (current) use of insulin: Secondary | ICD-10-CM | POA: Insufficient documentation

## 2020-06-08 HISTORY — DX: Depression, unspecified: F32.A

## 2020-06-08 HISTORY — DX: Anxiety disorder, unspecified: F41.9

## 2020-06-08 LAB — CBC WITH DIFFERENTIAL/PLATELET
Abs Immature Granulocytes: 0.01 10*3/uL (ref 0.00–0.07)
Basophils Absolute: 0 10*3/uL (ref 0.0–0.1)
Basophils Relative: 1 %
Eosinophils Absolute: 0.1 10*3/uL (ref 0.0–0.5)
Eosinophils Relative: 2 %
HCT: 43 % (ref 39.0–52.0)
Hemoglobin: 15.1 g/dL (ref 13.0–17.0)
Immature Granulocytes: 0 %
Lymphocytes Relative: 37 %
Lymphs Abs: 1.9 10*3/uL (ref 0.7–4.0)
MCH: 30 pg (ref 26.0–34.0)
MCHC: 35.1 g/dL (ref 30.0–36.0)
MCV: 85.3 fL (ref 80.0–100.0)
Monocytes Absolute: 0.4 10*3/uL (ref 0.1–1.0)
Monocytes Relative: 8 %
Neutro Abs: 2.6 10*3/uL (ref 1.7–7.7)
Neutrophils Relative %: 52 %
Platelets: 242 10*3/uL (ref 150–400)
RBC: 5.04 MIL/uL (ref 4.22–5.81)
RDW: 12.3 % (ref 11.5–15.5)
WBC: 5 10*3/uL (ref 4.0–10.5)
nRBC: 0 % (ref 0.0–0.2)

## 2020-06-08 LAB — CBG MONITORING, ED
Glucose-Capillary: 347 mg/dL — ABNORMAL HIGH (ref 70–99)
Glucose-Capillary: 305 mg/dL — ABNORMAL HIGH (ref 70–99)
Glucose-Capillary: 290 mg/dL — ABNORMAL HIGH (ref 70–99)

## 2020-06-08 LAB — COMPREHENSIVE METABOLIC PANEL
ALT: 51 U/L — ABNORMAL HIGH (ref 0–44)
AST: 28 U/L (ref 15–41)
Albumin: 4.6 g/dL (ref 3.5–5.0)
Alkaline Phosphatase: 86 U/L (ref 38–126)
Anion gap: 9 (ref 5–15)
BUN: 16 mg/dL (ref 6–20)
CO2: 23 mmol/L (ref 22–32)
Calcium: 9.2 mg/dL (ref 8.9–10.3)
Chloride: 101 mmol/L (ref 98–111)
Creatinine, Ser: 0.91 mg/dL (ref 0.61–1.24)
GFR, Estimated: >60 mL/min (ref >60)
Glucose, Bld: 369 mg/dL — ABNORMAL HIGH (ref 70–99)
Potassium: 3.9 mmol/L (ref 3.5–5.1)
Sodium: 133 mmol/L — ABNORMAL LOW (ref 135–145)
Total Bilirubin: 1.1 mg/dL (ref 0.3–1.2)
Total Protein: 7.3 g/dL (ref 6.5–8.1)

## 2020-06-08 LAB — ACETAMINOPHEN LEVEL: Acetaminophen (Tylenol), Serum: <10 ug/mL — ABNORMAL LOW (ref 10–30)

## 2020-06-08 LAB — RAPID URINE DRUG SCREEN, HOSP PERFORMED
Amphetamines: POSITIVE — AB
Barbiturates: NOT DETECTED
Benzodiazepines: NOT DETECTED
Cocaine: NOT DETECTED
Opiates: NOT DETECTED
Tetrahydrocannabinol: POSITIVE — AB

## 2020-06-08 LAB — SALICYLATE LEVEL: Salicylate Lvl: <7 mg/dL — ABNORMAL LOW (ref 7.0–30.0)

## 2020-06-08 LAB — ETHANOL: Alcohol, Ethyl (B): <10 mg/dL (ref <10)

## 2020-06-08 MED ORDER — INSULIN ASPART 100 UNIT/ML ~~LOC~~ SOLN
5.0000 [IU] | Freq: Once | SUBCUTANEOUS | Status: AC
  Administered 2020-06-08: 21:00:00 5 [IU] via SUBCUTANEOUS
  Filled 2020-06-08: qty 1

## 2020-06-08 MED ORDER — SODIUM CHLORIDE 0.9 % IV BOLUS
1000.0000 mL | Freq: Once | INTRAVENOUS | Status: AC
  Administered 2020-06-08: 19:00:00 1000 mL via INTRAVENOUS

## 2020-06-08 MED ORDER — INSULIN ASPART PROT & ASPART (70-30 MIX) 100 UNIT/ML ~~LOC~~ SUSP
20.0000 [IU] | Freq: Two times a day (BID) | SUBCUTANEOUS | Status: DC
  Administered 2020-06-09: 11:00:00 20 [IU] via SUBCUTANEOUS
  Filled 2020-06-08: qty 10

## 2020-06-08 MED ORDER — INSULIN REGULAR BOLUS VIA INFUSION: 5.0000 [IU] | Freq: Once | INTRAVENOUS | Status: DC

## 2020-06-08 NOTE — ED Notes (Signed)
Pt keys put in locker

## 2020-06-08 NOTE — ED Notes (Signed)
Pt talking with TTS  

## 2020-06-08 NOTE — ED Provider Notes (Signed)
  Pt signed out to me by Namon Cirri, PA-C pending patient's TTS consult.   Pt here under IVC, filed by his mother.  Pt denies SI, HI but IVC paperwork noted that he has suicidal ideations, paranoia and destroying property.    Pt was noted to have a slightly elevated blood sugar and given IVF's and insulin.  No sx's suggestive of DKA.  CBG trending downward.  Pt medically clear.  nightime medication have been ordered.     2245  I was notified by behavioral health counselor that Nira Conn, NP recommends observation overnight and psychiatry to reassess in the morning.   Pauline Aus, PA-C 06/08/20 2300    Terrilee Files, MD 06/09/20 1006
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
## 2020-06-08 NOTE — ED Notes (Signed)
IVC papers given to Diplomatic Services operational officer

## 2020-06-08 NOTE — ED Provider Notes (Signed)
[facility] EMERGENCY DEPARTMENT Provider Note   CSN: [id number] Arrival date & time: 06/08/20  1658     History Chief Complaint  Patient presents with  . IVC    AQUAN KOPE is a 39 y.o. male with past medical history significant for type 2 diabetes, depression, anxiety.  HPI Patient presents to emergency room today via [facility] under IVC paperwork. Patient states papers filed by his mother. Patient admits he and his mother do not have a good relationship. He is currently living on land that was divided up between himself, sister and mother after his father passed away and that his mother does not want him living there. He states they get into verbal fights daily. He admits to smoking mariguana and denies IVDA. He used to drink alcohol daily, stopped approximately one year ago. No alcohol consumption recently.  He denies feeling suicidal or homicidal.   IVC paperwork states patient is paranoid, awake all hours of the night and has been destructive to her property, recently cut a hold in her mattress. Has history of xanax abuse.     Past Medical History:  Diagnosis Date  . Anxiety   . Depression   . Diabetes mellitus without complication [facility])     Patient Active Problem List   Diagnosis Date Noted  . Diabetic hyperosmolar non-ketotic state ([place]) 12/27/2016  . Hyponatremia 12/27/2016  . Oral thrush 12/27/2016  . AKI (acute kidney injury) ([place]) 12/27/2016  . Tobacco abuse 12/27/2016    Past Surgical History:  Procedure Laterality Date  . KNEE ARTHROSCOPY         History reviewed. No pertinent family history.  Social History   Tobacco Use  . Smoking status: Current Every Day Smoker    Packs/day: 1.00    Types: Cigarettes  . Smokeless tobacco: Never Used  Vaping Use  . Vaping Use: Never used  Substance Use Topics  . Alcohol use: Yes    Comment: occ  . Drug use: Yes    Frequency: 7.0 times per week    Types: Marijuana    Home  Medications Prior to Admission medications   Medication Sig Start Date End Date Taking? Authorizing Provider  insulin aspart protamine- aspart (NOVOLOG MIX 70/30) (70-30) 100 UNIT/ML injection Inject 0.07 a males (7 units total) into the skin 2 times a day with a meal Patient taking differently: Inject 20 Units into the skin 2 (two) times daily with a meal. Inject 0.07 a males (7 units total) into the skin 2 times a day with a meal 06/07/18  Yes Triplett, Tammy, PA-C  blood glucose meter kit and supplies Dispense based on patient and insurance preference. Use up to four times daily as directed. (FOR ICD-9 250.00, 250.01). 12/29/16   Arrien, Jimmy Picket, MD  cyclobenzaprine (FLEXERIL) 10 MG tablet Take 1 tablet (10 mg total) by mouth 3 (three) times daily. Patient not taking: Reported on 06/08/2020 01/08/19   Lily Kocher, PA-C  dexamethasone (DECADRON) 4 MG tablet Take 1 tablet (4 mg total) by mouth 2 (two) times daily with a meal. Patient not taking: Reported on 06/08/2020 01/08/19   Lily Kocher, PA-C  ibuprofen (ADVIL) 600 MG tablet Take 1 tablet (600 mg total) by mouth 4 (four) times daily. Patient not taking: Reported on 06/08/2020 01/08/19   Lily Kocher, PA-C    Allergies    Patient has no known allergies.  Review of Systems   Review of Systems All other systems are reviewed and are negative  for acute change except as noted in the HPI.  Physical Exam Updated Vital Signs BP (!) 145/104 (BP Location: Right Arm)   Pulse 82   Temp 97.8 F (36.6 C) (Oral)   Resp 20   Ht 5' 11"  (1.803 m)   Wt 67.8 kg   SpO2 100%   BMI 20.85 kg/m   Physical Exam Vitals and nursing note reviewed.  Constitutional:      General: He is not in acute distress.    Appearance: He is not ill-appearing.  HENT:     Head: Normocephalic and atraumatic.     Right Ear: Tympanic membrane and external ear normal.     Left Ear: Tympanic membrane and external ear normal.     Nose: Nose normal.      Mouth/Throat:     Mouth: Mucous membranes are moist.     Pharynx: Oropharynx is clear.  Eyes:     General: No scleral icterus.       Right eye: No discharge.        Left eye: No discharge.     Extraocular Movements: Extraocular movements intact.     Conjunctiva/sclera: Conjunctivae normal.     Pupils: Pupils are equal, round, and reactive to light.  Neck:     Vascular: No JVD.  Cardiovascular:     Rate and Rhythm: Normal rate and regular rhythm.     Pulses: Normal pulses.          Radial pulses are 2+ on the right side and 2+ on the left side.     Heart sounds: Normal heart sounds.  Pulmonary:     Comments: Lungs clear to auscultation in all fields. Symmetric chest rise. No wheezing, rales, or rhonchi. Abdominal:     Comments: Abdomen is soft, non-distended, and non-tender in all quadrants. No rigidity, no guarding. No peritoneal signs.  Musculoskeletal:        General: Normal range of motion.     Cervical back: Normal range of motion.  Skin:    General: Skin is warm and dry.     Capillary Refill: Capillary refill takes less than 2 seconds.     Comments: No track marks seen on extremities.  Neurological:     Mental Status: He is oriented to person, place, and time.     GCS: GCS eye subscore is 4. GCS verbal subscore is 5. GCS motor subscore is 6.     Comments: Fluent speech, no facial droop.  Psychiatric:        Mood and Affect: Mood is anxious.        Speech: Speech normal.        Behavior: Behavior normal. Behavior is cooperative.        Thought Content: Thought content does not include homicidal or suicidal ideation. Thought content does not include homicidal or suicidal plan.     Comments: Does not appear to be responding to internal stimuli     ED Results / Procedures / Treatments   Labs (all labs ordered are listed, but only abnormal results are displayed) Labs Reviewed  COMPREHENSIVE METABOLIC PANEL - Abnormal; Notable for the following components:      Result  Value   Sodium 133 (*)    Glucose, Bld 369 (*)    ALT 51 (*)    All other components within normal limits  RAPID URINE DRUG SCREEN, HOSP PERFORMED - Abnormal; Notable for the following components:   Amphetamines POSITIVE (*)    Tetrahydrocannabinol POSITIVE (*)  All other components within normal limits  ACETAMINOPHEN LEVEL - Abnormal; Notable for the following components:   Acetaminophen (Tylenol), Serum <10 (*)    All other components within normal limits  SALICYLATE LEVEL - Abnormal; Notable for the following components:   Salicylate Lvl <0.5 (*)    All other components within normal limits  CBG MONITORING, ED - Abnormal; Notable for the following components:   Glucose-Capillary 347 (*)    All other components within normal limits  CBG MONITORING, ED - Abnormal; Notable for the following components:   Glucose-Capillary 305 (*)    All other components within normal limits  RESP PANEL BY RT-PCR (FLU A&B, COVID) ARPGX2  ETHANOL  CBC WITH DIFFERENTIAL/PLATELET    EKG None  Radiology No results found.  Procedures Procedures (including critical care time)  Medications Ordered in ED Medications  insulin aspart protamine- aspart (NOVOLOG MIX 70/30) injection 20 Units (has no administration in time range)  insulin aspart (novoLOG) injection 5 Units (has no administration in time range)  sodium chloride 0.9 % bolus 1,000 mL (1,000 mLs Intravenous New Bag/Given 06/08/20 1857)    ED Course  I have reviewed the triage vital signs and the nursing notes.  Pertinent labs & imaging results that were available during my care of the patient were reviewed by me and considered in my medical decision making (see chart for details).    MDM Rules/Calculators/A&P                          History provided by patient with additional history obtained from chart review.    Presenting under IVC paperwork filed by mother. Patient denies any suicidal or homicidal ideations. Does not appear  to be responding to internal stimuli. Denies suicidal or homicidal ideations however IVC paper work is concerning as it notes his paranoia and destruction of property with suicidal ideations. CBG on ED arrival 347. CBC unremarkable. CMP with hyperglycemia 369, no renal insufficiency, no significant left light derangement.  Labs do not indicate DKA. Salicylate, acetaminophen and ethanol level all negative. UDS positive for amphetamines or tetrahydrocannabinol.  Patient given 1 L NS. Glucose rechecked is 305 therefore 5 units of insulin ordered. Home medications and diabetic diet ordered. TTS evaluation has been ordered, not yet completed.   Patient care transferred to T. Triplett PA-C at the end of my shift pending TTS evaluation. Patient presentation, ED course, and plan of care discussed with review of all pertinent labs and imaging. Please see hernote for further details regarding further ED course and disposition.  Portions of this note were generated with [employer]. Dictation errors may occur despite best attempts at proofreading.    Final Clinical Impression(s) / ED Diagnoses Final diagnoses:  Involuntary commitment  Hyperglycemia    Rx / DC Orders ED Discharge Orders    None       Lewanda Rife 06/08/20 2056    Hayden Rasmussen, MD 06/09/20 1007

## 2020-06-08 NOTE — ED Triage Notes (Signed)
Pt to the ED [facility] with IVC paperwork.Paperwork states he said he wishes he would die.  Pt states he was a patient at [facility]   Patient denies SI/ HI.

## 2020-06-08 NOTE — ED Notes (Signed)
All belongings in the locker.

## 2020-06-09 DIAGNOSIS — F1510 Other stimulant abuse, uncomplicated: Secondary | ICD-10-CM

## 2020-06-09 LAB — CBG MONITORING, ED: Glucose-Capillary: 409 mg/dL — ABNORMAL HIGH (ref 70–99)
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
## 2020-06-09 NOTE — ED Notes (Signed)
Pt verbalized understanding of DC.  Pt home with police officer.

## 2020-06-09 NOTE — Discharge Instructions (Signed)
[facility] (Assessment, Medication management and counseling)  [address] [place], [place] [address] Phone: [phone] *Some walk-in assessment times available- call number above  [facility] (Medication management and counseling) Address: [address] [place], [place] [address] Phone: [phone] *Accepts walk-in's M-F starting at 8am  [facility] (Counseling specializing in trauma and co-occurring disorders) [email or web address] [address] [place], [place], [address] Phone:[phone] Email: [email or web address]  [facility] (Wellness classes, peer support) [address], [place], [place] [address] Phone: [phone]  [facility] (individual and group counseling) [address] [place], [place] [address] [phone]  Suicide Prevention information listed below: [facility] Crisis Line: [phone]) [phone]  Mobile Crisis Teams [facility] [phone]  If in crisis please go to your nearest emergency department to be evaluated or call 911

## 2020-06-09 NOTE — Progress Notes (Signed)
Inpatient Diabetes Program Recommendations  AACE/ADA: New Consensus Statement on Inpatient Glycemic Control (2015)  Target Ranges:  Prepandial:   less than 140 mg/dL      Peak postprandial:   less than 180 mg/dL (1-2 hours)      Critically ill patients:  140 - 180 mg/dL   Lab Results  Component Value Date   GLUCAP 290 (H) 06/08/2020   HGBA1C 13.3 (H) 12/27/2016    Review of Glycemic Control Results for ASMAR, BROZEK (MRN [id number]) as of 06/09/2020 09:40  Ref. Range 06/07/2018 10:55 06/08/2020 17:46 06/08/2020 20:24 06/08/2020 22:34  Glucose-Capillary Latest Ref Range: 70 - 99 mg/dL 030 (H) 092 (H) 330 (H) 290 (H)   Diabetes history: DM2 Outpatient Diabetes medications: Novolog 70/30 20 units bid Current orders for Inpatient glycemic control: Novolog 70/30 20 units bid  Inpatient Diabetes Program Recommendations:   -Add Novolog 0-9 correction tid while patient is in the hospital. -A1c to determine prehospital glycemic control Noted patient was diagnosed  On admission 12/29/16 and started on 70/30 insulin. Noted patient does not have insurance and could get insulin from [facility] Novolin Relion cheaper than Novolog unless has a program not listed. Will follow.  Thank you, Elijah Miller. Ajeet Casasola, RN, MSN, CDE  Diabetes Coordinator Inpatient Glycemic Control Team Team Pager [phone] (8am-5pm) 06/09/2020 9:44 AM

## 2020-06-09 NOTE — Progress Notes (Signed)
Pt has been psychiatric cleared per Marciano Sequin, NP. Outpatient resources have been placed in pt's AVS.    Wells Guiles, MSW, LCSW, LCAS Clinical Social Worker II Disposition CSW [phone]

## 2020-06-09 NOTE — ED Provider Notes (Signed)
Emergency Medicine Observation Re-evaluation Note  Elijah Miller is a 39 y.o. male, seen on rounds today.  Pt initially presented to the ED for complaints of IVC Currently, the patient is without complaints.   Physical Exam  BP 112/70 (BP Location: Left Arm)   Pulse 75   Temp 97.6 F (36.4 C) (Oral)   Resp 17   Ht 5\' 11"  (1.803 m)   Wt 67.8 kg   SpO2 98%   BMI 20.85 kg/m  Physical Exam General: Awake and alert   Psych: Calm cooperative  ED Course / MDM  EKG:    I have reviewed the labs performed to date as well as medications administered while in observation.  Recent changes in the last 24 hours include none.  Plan  Current plan is for discharged home. Patient re-evaluated by psychiatry and cleared for discharge. No current SI. Patient is no longer under full IVC at this time which has been rescinded   , MD 06/09/20 1059

## 2021-03-23 IMAGING — DX LUMBAR SPINE - COMPLETE 4+ VIEW
5 series · 5 of 5 positions shown · non-contrast
Comparison: None.

CLINICAL DATA: Lumbago after heavy lifting. Left lower extremity
radicular symptoms

EXAM:
LUMBAR SPINE - COMPLETE 4+ VIEW

[l-spine ap]
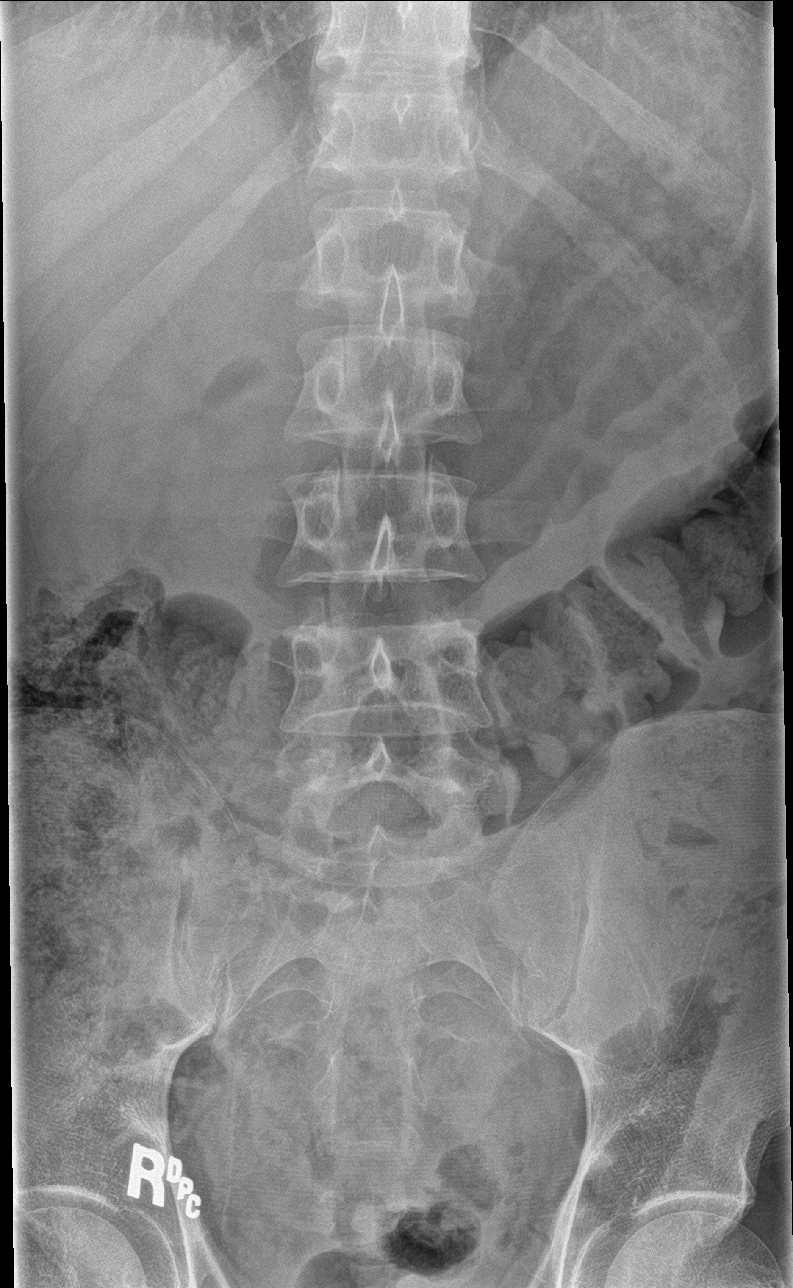

[l-spine obl (1 of 2)]
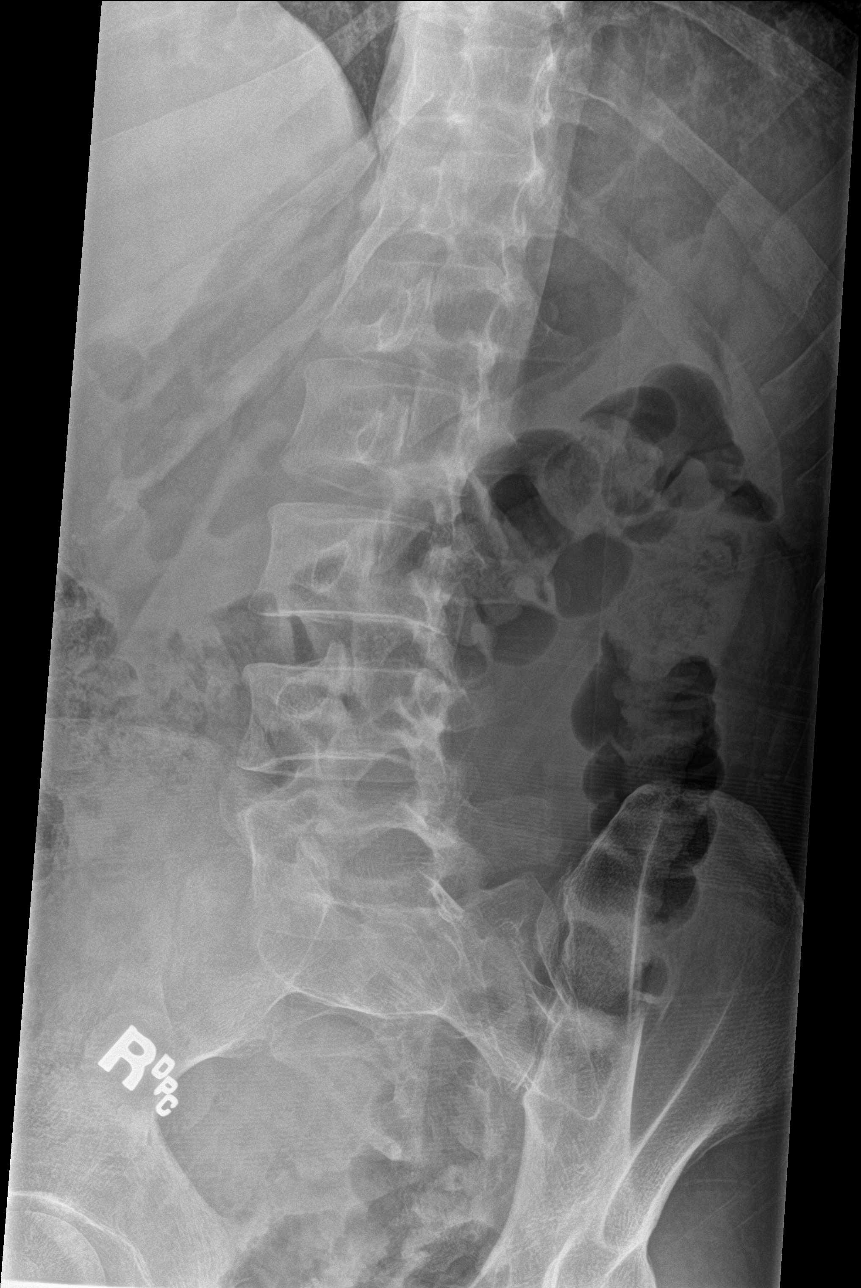

[l-spine obl (2 of 2)]
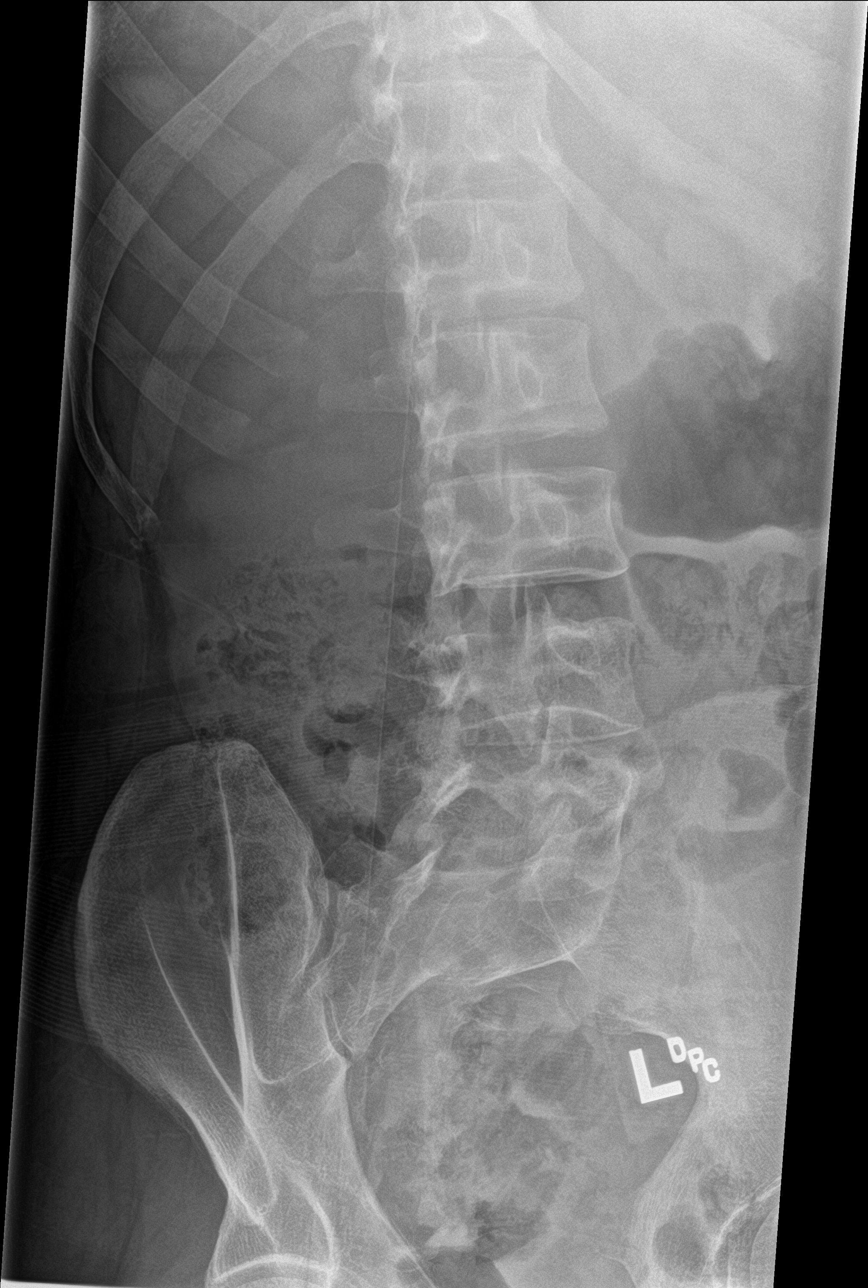

[l-spine lat]
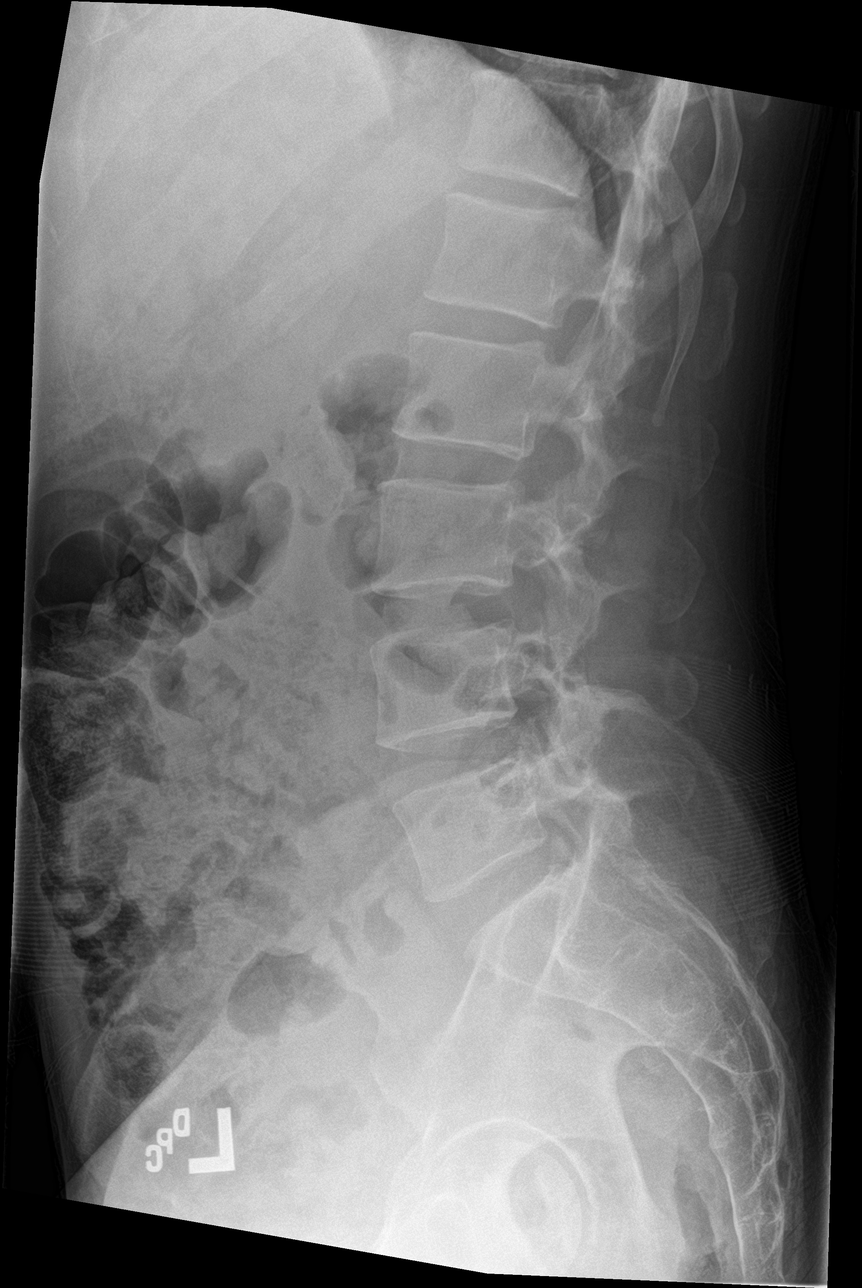

[l-spine spot]
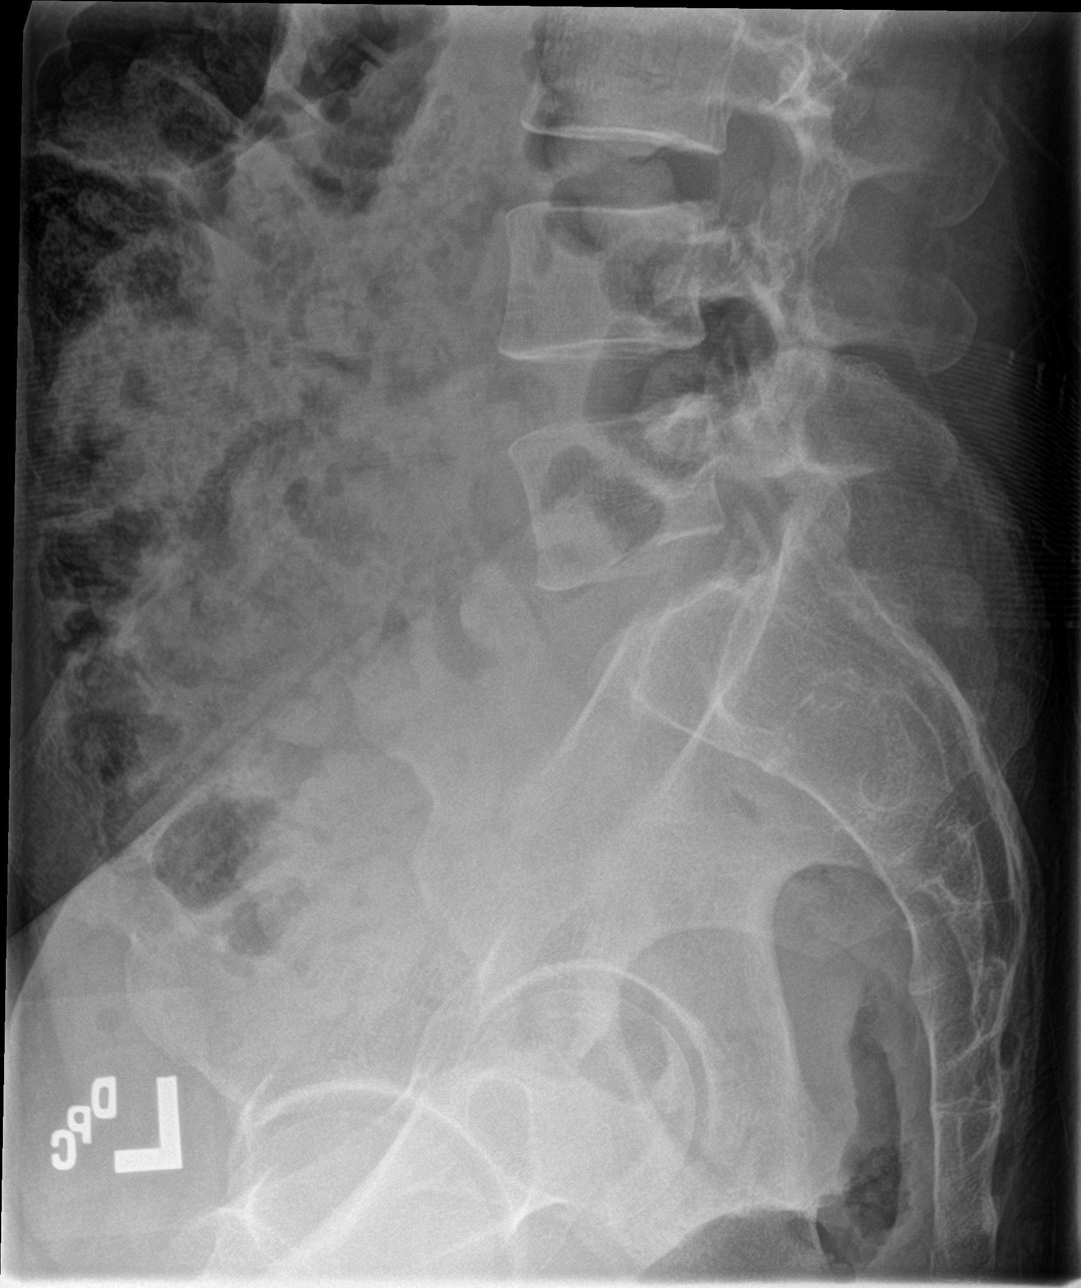

[5 of 5 positions shown; findings below may reference images not displayed]

FINDINGS: Frontal, lateral, spot lumbosacral lateral, and bilateral oblique
views were obtained. There are 5 non-rib-bearing lumbar type
vertebral bodies. There is no fracture or spondylolisthesis. The
disc spaces appear unremarkable. There is no appreciable facet
arthropathy.
IMPRESSION: No fracture or spondylolisthesis.  No evident arthropathy.

## 2021-08-11 DIAGNOSIS — R109 Unspecified abdominal pain: Secondary | ICD-10-CM | POA: Diagnosis not present

## 2021-08-11 DIAGNOSIS — R1110 Vomiting, unspecified: Secondary | ICD-10-CM | POA: Diagnosis not present

## 2021-08-11 DIAGNOSIS — Z794 Long term (current) use of insulin: Secondary | ICD-10-CM | POA: Diagnosis not present

## 2021-08-11 DIAGNOSIS — E1165 Type 2 diabetes mellitus with hyperglycemia: Secondary | ICD-10-CM | POA: Diagnosis not present

## 2021-08-11 DIAGNOSIS — R69 Illness, unspecified: Secondary | ICD-10-CM | POA: Diagnosis not present

## 2021-08-11 DIAGNOSIS — K5900 Constipation, unspecified: Secondary | ICD-10-CM | POA: Diagnosis not present

## 2021-08-11 DIAGNOSIS — R197 Diarrhea, unspecified: Secondary | ICD-10-CM | POA: Diagnosis not present

## 2021-08-11 DIAGNOSIS — K529 Noninfective gastroenteritis and colitis, unspecified: Secondary | ICD-10-CM | POA: Diagnosis not present

## 2023-03-19 DIAGNOSIS — Z8709 Personal history of other diseases of the respiratory system: Secondary | ICD-10-CM | POA: Diagnosis not present

## 2023-03-19 DIAGNOSIS — R0602 Shortness of breath: Secondary | ICD-10-CM | POA: Diagnosis not present

## 2023-03-19 DIAGNOSIS — E1165 Type 2 diabetes mellitus with hyperglycemia: Secondary | ICD-10-CM | POA: Diagnosis not present

## 2023-12-05 DIAGNOSIS — R21 Rash and other nonspecific skin eruption: Secondary | ICD-10-CM | POA: Diagnosis not present

## 2023-12-05 DIAGNOSIS — R0602 Shortness of breath: Secondary | ICD-10-CM | POA: Diagnosis not present

## 2023-12-05 DIAGNOSIS — E1365 Other specified diabetes mellitus with hyperglycemia: Secondary | ICD-10-CM | POA: Diagnosis not present
